# Patient Record
Sex: Male | Born: 1950 | Race: White | Hispanic: No | Marital: Married | Smoking: Current every day smoker
Health system: Southern US, Community
[De-identification: ages and names within clinical notes are randomized; demographics above are authoritative.]

## PROBLEM LIST (undated history)

## (undated) DIAGNOSIS — D72829 Elevated white blood cell count, unspecified: Secondary | ICD-10-CM

## (undated) DIAGNOSIS — I5042 Chronic combined systolic (congestive) and diastolic (congestive) heart failure: Secondary | ICD-10-CM

## (undated) DIAGNOSIS — I4729 Other ventricular tachycardia: Secondary | ICD-10-CM

## (undated) DIAGNOSIS — E785 Hyperlipidemia, unspecified: Secondary | ICD-10-CM

## (undated) DIAGNOSIS — G4733 Obstructive sleep apnea (adult) (pediatric): Secondary | ICD-10-CM

## (undated) DIAGNOSIS — I428 Other cardiomyopathies: Secondary | ICD-10-CM

## (undated) DIAGNOSIS — I472 Ventricular tachycardia: Secondary | ICD-10-CM

## (undated) DIAGNOSIS — E669 Obesity, unspecified: Secondary | ICD-10-CM

## (undated) DIAGNOSIS — I10 Essential (primary) hypertension: Secondary | ICD-10-CM

## (undated) HISTORY — DX: Other ventricular tachycardia: I47.29

## (undated) HISTORY — DX: Elevated white blood cell count, unspecified: D72.829

## (undated) HISTORY — DX: Chronic combined systolic (congestive) and diastolic (congestive) heart failure: I50.42

## (undated) HISTORY — DX: Obstructive sleep apnea (adult) (pediatric): G47.33

## (undated) HISTORY — DX: Obesity, unspecified: E66.9

## (undated) HISTORY — DX: Ventricular tachycardia: I47.2

## (undated) HISTORY — DX: Essential (primary) hypertension: I10

## (undated) HISTORY — DX: Other cardiomyopathies: I42.8

## (undated) HISTORY — DX: Hyperlipidemia, unspecified: E78.5

---

## 2004-02-24 ENCOUNTER — Ambulatory Visit (HOSPITAL_COMMUNITY): Admission: RE | Admit: 2004-02-24 | Discharge: 2004-02-24 | Payer: Self-pay | Admitting: Gastroenterology

## 2004-02-24 ENCOUNTER — Encounter (INDEPENDENT_AMBULATORY_CARE_PROVIDER_SITE_OTHER): Payer: Self-pay | Admitting: Specialist

## 2006-03-12 ENCOUNTER — Emergency Department (HOSPITAL_COMMUNITY): Admission: EM | Admit: 2006-03-12 | Discharge: 2006-03-12 | Payer: Self-pay | Admitting: Emergency Medicine

## 2006-12-05 ENCOUNTER — Encounter: Admission: RE | Admit: 2006-12-05 | Discharge: 2006-12-05 | Payer: Self-pay | Admitting: Cardiology

## 2007-01-25 ENCOUNTER — Encounter: Payer: Self-pay | Admitting: Pulmonary Disease

## 2009-02-14 ENCOUNTER — Ambulatory Visit: Payer: Self-pay | Admitting: Cardiology

## 2009-02-14 ENCOUNTER — Inpatient Hospital Stay (HOSPITAL_COMMUNITY): Admission: EM | Admit: 2009-02-14 | Discharge: 2009-02-16 | Payer: Self-pay | Admitting: Emergency Medicine

## 2009-02-15 ENCOUNTER — Encounter (INDEPENDENT_AMBULATORY_CARE_PROVIDER_SITE_OTHER): Payer: Self-pay | Admitting: Internal Medicine

## 2009-02-20 ENCOUNTER — Telehealth: Payer: Self-pay | Admitting: Cardiology

## 2009-02-23 ENCOUNTER — Telehealth (INDEPENDENT_AMBULATORY_CARE_PROVIDER_SITE_OTHER): Payer: Self-pay | Admitting: *Deleted

## 2009-02-24 DIAGNOSIS — E669 Obesity, unspecified: Secondary | ICD-10-CM | POA: Insufficient documentation

## 2009-02-24 DIAGNOSIS — I10 Essential (primary) hypertension: Secondary | ICD-10-CM

## 2009-02-24 DIAGNOSIS — E785 Hyperlipidemia, unspecified: Secondary | ICD-10-CM

## 2009-02-24 DIAGNOSIS — G4733 Obstructive sleep apnea (adult) (pediatric): Secondary | ICD-10-CM

## 2009-02-24 DIAGNOSIS — I5043 Acute on chronic combined systolic (congestive) and diastolic (congestive) heart failure: Secondary | ICD-10-CM

## 2009-02-24 DIAGNOSIS — I472 Ventricular tachycardia: Secondary | ICD-10-CM

## 2009-02-24 DIAGNOSIS — R0602 Shortness of breath: Secondary | ICD-10-CM | POA: Insufficient documentation

## 2009-02-27 ENCOUNTER — Ambulatory Visit: Payer: Self-pay | Admitting: Pulmonary Disease

## 2009-02-28 ENCOUNTER — Encounter: Payer: Self-pay | Admitting: Pulmonary Disease

## 2009-03-01 ENCOUNTER — Ambulatory Visit: Payer: Self-pay | Admitting: Pulmonary Disease

## 2009-03-02 ENCOUNTER — Ambulatory Visit: Payer: Self-pay | Admitting: Family Medicine

## 2009-03-02 LAB — CONVERTED CEMR LAB
Chloride: 100 meq/L (ref 96–112)
Creatinine, Ser: 1.4 mg/dL (ref 0.4–1.5)
Potassium: 5.2 meq/L — ABNORMAL HIGH (ref 3.5–5.1)
Sodium: 137 meq/L (ref 135–145)

## 2009-03-07 ENCOUNTER — Ambulatory Visit: Payer: Self-pay | Admitting: Cardiology

## 2009-03-21 ENCOUNTER — Telehealth: Payer: Self-pay | Admitting: Nurse Practitioner

## 2009-05-09 ENCOUNTER — Encounter: Payer: Self-pay | Admitting: Family Medicine

## 2009-06-05 ENCOUNTER — Ambulatory Visit: Payer: Self-pay | Admitting: Cardiology

## 2009-06-05 DIAGNOSIS — I5042 Chronic combined systolic (congestive) and diastolic (congestive) heart failure: Secondary | ICD-10-CM | POA: Insufficient documentation

## 2009-09-13 ENCOUNTER — Ambulatory Visit: Payer: Self-pay | Admitting: Cardiology

## 2009-09-13 ENCOUNTER — Ambulatory Visit: Payer: Self-pay

## 2009-09-13 ENCOUNTER — Ambulatory Visit (HOSPITAL_COMMUNITY): Admission: RE | Admit: 2009-09-13 | Discharge: 2009-09-13 | Payer: Self-pay | Admitting: Cardiology

## 2009-10-05 ENCOUNTER — Ambulatory Visit: Payer: Self-pay | Admitting: Pulmonary Disease

## 2010-02-20 NOTE — Progress Notes (Signed)
  Phone Note From Other Clinic   Caller: Nurse Summary of Call: JULIE CALLED THIS AM RE PT'S  B/P 96/54 AND 94/ AFTER STANDING C/O SLIGHT DIZZINESS INSTRUCTED TO HAVE PT KEEP B/P LOG AND IF CONT TO RUN LOW OR HAS WORSENED S/S TO CALL OFF. HAS APPT THIS MONTH WITH DR WALL. VERBALZIED UNDERSTANDING Initial call taken by: Scherrie Bateman, LPN,  February 23, 2009 3:38 PM

## 2010-02-20 NOTE — Assessment & Plan Note (Signed)
Summary: Wesley Castro (TXFR FROM [facility]) // RS   Vital Signs:  Patient profile:   60 year old male Height:      68.75 inches Weight:      245 pounds BMI:     36.58 Temp:     97.9 degrees F oral Pulse rate:   80 / minute Pulse rhythm:   regular Resp:     12 per minute BP sitting:   82 / 60  (left arm) BP standing:   80 / 50 Cuff size:   large  Vitals Entered By: Sid Falcon LPN (March 02, 2009 10:44 AM)  Nutrition Counseling: Patient's BMI is greater than 25 and therefore counseled on weight management options.  Serial Vital Signs/Assessments:  Time      Position  BP       Pulse  Resp  Temp     By           Sitting   82/50                          Evelena Peat MD           Standing  80/50                          Evelena Peat MD  CC: New to establish, Hospital follow-up Is Patient Diabetic? No   History of Present Illness: Hospital followup and patient here to establish care.  Recent admission 25th of January with dyspnea and hypoxic respiratory failure. Initially thought to be COPD exacerbation and patient treated with high-dose steroids and empiric antibiotics. Eventually clear this was heart failure. Patient had echocardiogram which confirmed acute on chronic heart failure with ejection fraction 40-45%. Patient improved with diuresis. No chest pain. BNP 299. CT angiogram of chest revealed no pulmonary emboli. Patient placed on several appropriate heart failure medications and improved dramatically with regard to symptoms. No prior history of known heart failure.  Patient compliant with diet and medications since discharge. Daily weight stable. By his scales 243 pounds. Denies any orthopnea or PND.  no orthostatic symptoms  History obstructive sleep apnea. Recent followup with pulmonologist. Outpatient pulse oximetry. never treated with CPAP.  currently sleeping well.  Other hospital issues included mild steroid-induced hyperglycemia. No prior history of diabetes. Brief  asymptomatic nonsustained ventricular tachycardia. Dyslipidemia with LDL 132 and patient placed on simvastatin. Patient has history of obesity and poor compliance. Smoking until his recent admission but abstinent since then. Recently started Weight Watchers program.  Preventive Screening-Counseling & Management  Alcohol-Tobacco     Smoking Status: quit     Year Started: 1970     Year Quit: 02/14/2009  Allergies (verified): No Known Drug Allergies  Past History:  Past Medical History: Heart arrhythmia Acute on Chronic heart failure, EF 40-45% 1/11 Heart murmur Hyperlipidemia Hypertension  Family History: Family History Lung cancer, maternal grandfather, deceased age 19 Family History Ovarian cancer, mother, deceased age 56  Social History: Occupation:  [employer] Married Alcohol use-yes Smoked all his life, quit Feb 14, 2009 Smoking Status:  quit Occupation:  employed  Review of Systems  The patient denies anorexia, fever, chest pain, syncope, dyspnea on exertion, peripheral edema, prolonged cough, headaches, abdominal pain, and difficulty walking.    Physical Exam  General:  Well-developed,well-nourished,in no acute distress; alert,appropriate and cooperative throughout examination Ears:  External ear exam shows no significant lesions or deformities.  Otoscopic examination reveals  clear canals, tympanic membranes are intact bilaterally without bulging, retraction, inflammation or discharge. Hearing is grossly normal bilaterally. Mouth:  Oral mucosa and oropharynx without lesions or exudates.  Teeth in good repair. Neck:  No deformities, masses, or tenderness noted. Lungs:  Normal respiratory effort, chest expands symmetrically. Lungs are clear to auscultation, no crackles or wheezes. Heart:  normal rate, regular rhythm, no murmur, and no gallop.   Extremities:  no pitting edema noted.   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  Home weights stable. His updated medication list for this problem includes:    Aspirin 81 Mg Tbec (Aspirin) .Marland Kitchen... Take one tablet by mouth daily    Carvedilol 3.125 Mg Tabs (Carvedilol) .Marland Kitchen... 1 tab two times a day    Furosemide 40 Mg Tabs (Furosemide) .Marland Kitchen... 1 tab once daily    Lisinopril 10 Mg Tabs (Lisinopril) .Marland Kitchen... 1 tab once daily    Spironolactone 25 Mg Tabs (Spironolactone) .Marland Kitchen... 1 tab once daily  Orders: TLB-BMP (Basic Metabolic Panel-BMET) (80048-METABOL)  Problem # 2:  HYPERTENSION (ICD-401.9) Low today but not symptomatic.  May have to back off some meds.  Check BMP His updated medication list for this problem includes:    Carvedilol 3.125 Mg Tabs (Carvedilol) .Marland Kitchen... 1 tab two times a day    Furosemide 40 Mg Tabs (Furosemide) .Marland Kitchen... 1 tab once daily    Lisinopril 10 Mg Tabs (Lisinopril) .Marland Kitchen... 1 tab once daily    Spironolactone 25 Mg Tabs (Spironolactone) .Marland Kitchen... 1 tab once daily  Problem # 3:  HYPERLIPIDEMIA (ICD-272.4) Will need lipids in 4-6 weeks. His updated medication list for this problem includes:    Simvastatin 20 Mg Tabs (Simvastatin) .Marland Kitchen... 1 tab once daily  Problem # 4:  OBESITY (ICD-278.00) Working on weight loss.   Problem # 5:  HYPERGLYCEMIA/ STEROID INDUCED (ICD-790.29)  Problem # 6:  SLEEP APNEA, OBSTRUCTIVE (ICD-327.23) F/u with pulmonary.  Complete Medication List: 1)  Aspirin 81 Mg Tbec (Aspirin) .... Take one tablet by mouth daily 2)  Carvedilol 3.125 Mg Tabs (Carvedilol) .Marland Kitchen.. 1 tab two times a day 3)  Furosemide 40 Mg Tabs (Furosemide) .Marland Kitchen.. 1 tab once daily 4)  Lisinopril 10 Mg Tabs (Lisinopril) .Marland Kitchen.. 1 tab once daily 5)  Spironolactone 25 Mg Tabs (Spironolactone) .Marland Kitchen.. 1 tab once daily 6)  Simvastatin 20 Mg Tabs (Simvastatin) .Marland Kitchen.. 1 tab once daily 7)  Ibuprofen 400 Mg Tabs (Ibuprofen) .... As needed  Patient Instructions: 1)  Continue sodium restriction 2)  Continue daily weights 3)  Be in touch immediately  if you have any significant dizziness or lightheadedness 4)  Please schedule a follow-up appointment in 1 month.  5)  BMP prior to visit, ICD-9: 401.9 6)  Hepatic Panel prior to visit ICD-9: 272.4 7)  Lipid panel prior to visit ICD-9 : 272.4  Preventive Care Screening  Colonoscopy:    Date:  01/21/2005    Results:  normal

## 2010-02-20 NOTE — Assessment & Plan Note (Signed)
Summary: eph/jml   Visit Type:  EPH Referring Provider:  Valera Castle      Primary Provider:  Burchett  CC:  pt states he has had a little dizziness since he has come out of the hosp.Marland Kitchenotherwise denies any other complaints today.  History of Present Illness: Mr Chait comes in today for followup post hospitalization.  He is a delightful 60 year old gentleman who presented to [facility] with progressive shortness of breath. He was found to have acute on chronic combined congestive heart failure. He was also hypertensive and had a history of hypertension. 3 echocardiogram showed that he ejection fraction of 45% with global hypokinesia and mild left ventricular dilatation.  He diuresed with normalization of his blood pressures. He was noted to have steroid-induced hyperglycemia. He has some nonsustained ventricular tachycardia which was asymptomatic.  Since discharge, he is taken our heart healthy message to heart. He has lost about a pound a week, has been very careful with his sodium, using no salt, with now low blood pressure. The other day in Dr. Lucie Leather his systolic pressures were in the low 80s. He has been dizzy. Electrolytes showed potassium of 5.2 which is minimally elevated. He is taking spironolactone.   He denies orthopnea, PND or peripheral edema.  He has stopped smoking  Current Medications (verified): 1)  Aspirin 81 Mg Tbec (Aspirin) .... Take One Tablet By Mouth Daily 2)  Carvedilol 3.125 Mg Tabs (Carvedilol) .Marland Kitchen.. 1 Tab Two Times A Day 3)  Furosemide 40 Mg Tabs (Furosemide) .Marland Kitchen.. 1 Tab Once Daily 4)  Lisinopril 10 Mg Tabs (Lisinopril) .Marland Kitchen.. 1 Tab Once Daily 5)  Spironolactone 25 Mg Tabs (Spironolactone) .Marland Kitchen.. 1 Tab Once Daily 6)  Simvastatin 20 Mg Tabs (Simvastatin) .Marland Kitchen.. 1 Tab Once Daily 7)  Ibuprofen 400 Mg Tabs (Ibuprofen) .... As Needed  Allergies (verified): No Known Drug Allergies  Past History:  Past Medical History: Last updated: 03/02/2009 Heart  arrhythmia Acute on Chronic heart failure, EF 40-45% 1/11 Heart murmur Hyperlipidemia Hypertension  Past Surgical History: Last updated: 02/27/2009 none per pt report.   Family History: Last updated: 03/02/2009 Family History Lung cancer, maternal grandfather, deceased age 50 Family History Ovarian cancer, mother, deceased age 56  Social History: Last updated: 03/02/2009 Occupation:  [employer] Married Alcohol use-yes Smoked all his life, quit Feb 14, 2009  Risk Factors: Smoking Status: quit (03/02/2009)  Review of Systems       negative other than history of present illness  Vital Signs:  Patient profile:   60 year old male Height:      68.75 inches Weight:      249 pounds BMI:     37.17 Pulse rate:   65 / minute Pulse rhythm:   irregular BP sitting:   88 / 64  (left arm) BP standing:   78 / 54  (right arm) Cuff size:   large  Vitals Entered By: Danielle Rankin, CMA (March 07, 2009 11:51 AM)  Physical Exam  General:  obese.   Head:  normocephalic and atraumatic Eyes:  PERRLA/EOM intact; conjunctiva and lids normal. Neck:  Neck supple, no JVD. No masses, thyromegaly or abnormal cervical nodes. Chest Espn Zeman:  no deformities or breast masses noted Lungs:  clear to auscultation percussion Heart:  PMI poorly appreciated, normal S1-S2 no gallop, regular rate and rhythm Msk:  Back normal, normal gait. Muscle strength and tone normal. Pulses:  pulses normal in all 4 extremities Extremities:  trace left pedal edema and trace right pedal edema.  Neurologic:  Alert and oriented x 3. Skin:  Intact without lesions or rashes. Psych:  Normal affect.   EKG  Procedure date:  03/07/2009  Findings:      normal sinus rhythm, poor progression inter-precordium, no change  Impression & Recommendations:  Problem # 1:  COMBINED HEART FAILURE, ACUTE ON CHRONIC (ICD-428.43) Assessment Improved The pdated medication list for this problem includes:    Aspirin 81 Mg Tbec  (Aspirin) .Marland Kitchen... Take one tablet by mouth daily    Carvedilol 3.125 Mg Tabs (Carvedilol) .Marland Kitchen... 1 tab two times a day    Furosemide 40 Mg Tabs (Furosemide) .Marland Kitchen... 1 tab once daily    Lisinopril 10 Mg Tabs (Lisinopril) .Marland Kitchen... 1 tab once daily    Spironolactone 25 Mg Tabs (Spironolactone) .Marland Kitchen... 1 tab once daily  Orders: EKG w/ Interpretation (93000)  Problem # 2:  HYPERTENSION (ICD-401.9) Assessment: Improved His blood pressure is now low and he is symptomatic with dizziness. He's been remarkably compliant with his diet decreased sodium. The nose saw his increased his potassium along with the spironolactone.  We will discontinue the spironolactone. He will continue to monitor his salt and will continue use no salt substitute. He will followup with electrolytes with Dr. Caryl Never in a couple weeks. He will check his blood pressure work and send the results to [place]. He will continue to try to lose a pound a week. I am really impressed with his compliance and change in lifestyle. His updated medication list for this problem includes:    Aspirin 81 Mg Tbec (Aspirin) .Marland Kitchen... Take one tablet by mouth daily    Carvedilol 3.125 Mg Tabs (Carvedilol) .Marland Kitchen... 1 tab two times a day    Furosemide 40 Mg Tabs (Furosemide) .Marland Kitchen... 1 tab once daily    Lisinopril 10 Mg Tabs (Lisinopril) .Marland Kitchen... 1 tab once daily    Spironolactone 25 Mg Tabs (Spironolactone) .Marland Kitchen... 1 tab once daily  Problem # 3:  VENTRICULAR TACHYCARDIA (ICD-427.1) Assessment: Unchanged  His updated medication list for this problem includes:    Aspirin 81 Mg Tbec (Aspirin) .Marland Kitchen... Take one tablet by mouth daily    Carvedilol 3.125 Mg Tabs (Carvedilol) .Marland Kitchen... 1 tab two times a day    Lisinopril 10 Mg Tabs (Lisinopril) .Marland Kitchen... 1 tab once daily  Problem # 4:  OBESITY (ICD-278.00) Assessment: Improved  Patient Instructions: 1)  Your physician recommends that you schedule a follow-up appointment in: 3 MONTHS WITH DR Faren Florence   2)  Your physician has recommended you  make the following change in your medication: STOP IBUPROFEN AND SPIRONOLACTONE MAY TAKE TYLENOL

## 2010-02-20 NOTE — Assessment & Plan Note (Signed)
Summary: consult for osa   Copy to:  Valera Castle      Primary Provider/Referring Provider:  Burchett  CC:  Sleep Consult.  History of Present Illness: The pt is a 60y/o male who I have been asked to see for osa.  The pt was diagnosed with severe osa in 2009, with an AHI of 45/hr and desat as low as 77%.  Cpap was recommended for treatment, however the patient felt he could not wear under any circumstances.  Since that time, the pt has lost over 40 pounds, and feels that he is sleeping much better.  He has very little snoring according to his wife, and she has not seen abnormal breathing patterns during sleep.  He goes to bed btw 11-33mn, and arises at 5:30 am to start his day.  He feels that he sleeps well, and feels rested in the mornings upon arising.  The pt notes no sleep pressure during the day with periods of inactivity, and this is in sharp contrast to the past.  He does not doze in evening watching tv, and has no sleepiness with driving.  His epworth score today is 4.  Preventive Screening-Counseling & Management  Alcohol-Tobacco     Smoking Status: quit  Medications Prior to Update: 1)  Aspirin 81 Mg Tbec (Aspirin) .... Take One Tablet By Mouth Daily 2)  Carvedilol 3.125 Mg Tabs (Carvedilol) .Marland Kitchen.. 1 Tab Two Times A Day 3)  Furosemide 40 Mg Tabs (Furosemide) .Marland Kitchen.. 1 Tab Once Daily 4)  Lisinopril 10 Mg Tabs (Lisinopril) .Marland Kitchen.. 1 Tab Once Daily 5)  Spironolactone 25 Mg Tabs (Spironolactone) .Marland Kitchen.. 1 Tab Once Daily 6)  Simvastatin 20 Mg Tabs (Simvastatin) .Marland Kitchen.. 1 Tab Once Daily 7)  Ibuprofen 400 Mg Tabs (Ibuprofen) .... As Needed  Allergies (verified): No Known Drug Allergies  Past History:  Past Medical History:  HYPERTENSION (ICD-401.9) COMBINED HEART FAILURE, ACUTE ON CHRONIC (ICD-428.43) VENTRICULAR TACHYCARDIA (ICD-427.1) DYSLIPIDEMIA (ICD-272.4) OBESITY (ICD-278.00) LEUKOCYTOSIS/ STEROID INDUCED (ICD-288.60) HYPERGLYCEMIA/ STEROID INDUCED (ICD-790.29) SLEEP APNEA,  OBSTRUCTIVE (ICD-327.23)    Past Surgical History: none per pt report.   Family History: Reviewed history and no changes required. cancer: mother (male organs) maternal grandmother ( skin), maternal grandfather (lung)   Social History: Reviewed history and no changes required. Patient states former smoker.  started at [facility] 14.  1 1/2 ppd.  quit Jan 2011. pt is married. pt has children. pt works at [employer].  Smoking Status:  quit  Review of Systems       The patient complains of shortness of breath with activity, productive cough, non-productive cough, irregular heartbeats, and weight change.  The patient denies shortness of breath at rest, coughing up blood, chest pain, acid heartburn, indigestion, loss of appetite, abdominal pain, difficulty swallowing, sore throat, tooth/dental problems, headaches, nasal congestion/difficulty breathing through nose, sneezing, itching, ear ache, anxiety, depression, hand/feet swelling, joint stiffness or pain, rash, change in color of mucus, and fever.    Vital Signs:  Patient profile:   61 year old male Height:      70 inches Weight:      250.50 pounds BMI:     36.07 O2 Sat:      94 % on Room air Temp:     97.9 degrees F oral Pulse rate:   69 / minute BP sitting:   108 / 68  (left arm) Cuff size:   large  Vitals Entered By: Arman Filter LPN (February 27, 2009 11:16 AM)  O2 Flow:  Room  air  Physical Exam  General:  ow male in nad  Eyes:  PERRLA and EOMI.   Nose:  deviated septum to right with crusting bilat Mouth:  moderate elongation of soft palate and uvula Neck:  no jvd, tmg, LN Lungs:  clear to auscultation Heart:  rrr, with occas ectopic no mrg Abdomen:  soft and nontender, bs+ Extremities:  no signficant edema, pulses intact distally no cyanosis Neurologic:  alert and oriented, moves all 4.   Impression & Recommendations:  Problem # 1:  SLEEP APNEA, OBSTRUCTIVE (ICD-327.23) the pt has a h/o severe osa, but has lost  a significant amount of weight and feels that he is no longer symptomatic.  He sleeps well, awakens refreshed, denies daytime sleepiness, and his wife has noted very little snoring.  Given the severity of his past osa, and his underlying comorbid cardiac disease, I think he needs to be re-studied.  We should be able to establish his level of sleep disorded breathing with a screening study at home given the circumstances.  The pt is agreeable to this approach.  Other Orders: Consultation Level IV (16109) Misc. Referral (Misc. Ref)  Patient Instructions: 1)  will check screening study at home to see if you still have signficant sleep apnea.  I will call with results. 2)  continue to work on weight loss with goal of 200 pounds for now.

## 2010-02-20 NOTE — Assessment & Plan Note (Signed)
Summary: rov for osa   Copy to:  Valera Castle      Primary Kyrie Bun/Referring Tomothy Eddins:  Burchett  CC:  follow up on sleep pt not on cpap.  Pt states cardiology told him to make this apt to f/u on sleep. pt states he has no complaints or concerns today. Per apnea link results in feb  pt was to work on weight loss and f/u in 6 month..  History of Present Illness: the pt comes in today for f/u of his known mild osa.  He has a h/o severe osa, but lost at least 50 pounds, and his f/u study showed mild disease with AHI 9/hr.  We agreed that he would stop cpap, continue to work on weight loss, and he would f/u with me in 6mos to check on progress.  He still does not snore, feels he sleeps well, and denies daytime sleepiness with inactivity.  However, he has gained 17 pounds back since his last visit with me.  Current Medications (verified): 1)  Aspirin 81 Mg Tbec (Aspirin) .... Take One Tablet By Mouth Daily 2)  Carvedilol 3.125 Mg Tabs (Carvedilol) .Marland Kitchen.. 1 Tab Two Times A Day 3)  Furosemide 40 Mg Tabs (Furosemide) .Marland Kitchen.. 1 Tab Once Daily 4)  Lisinopril 10 Mg Tabs (Lisinopril) .Marland Kitchen.. 1 Tab Once Daily 5)  Simvastatin 20 Mg Tabs (Simvastatin) .Marland Kitchen.. 1 Tab At Bedtime  Allergies (verified): No Known Drug Allergies  Review of Systems  The patient denies shortness of breath at rest, productive cough, non-productive cough, coughing up blood, chest pain, irregular heartbeats, acid heartburn, indigestion, loss of appetite, weight change, abdominal pain, difficulty swallowing, sore throat, tooth/dental problems, headaches, nasal congestion/difficulty breathing through nose, sneezing, itching, ear ache, anxiety, depression, hand/feet swelling, joint stiffness or pain, rash, change in color of mucus, and fever.    Vital Signs:  Patient profile:   60 year old male Height:      68.75 inches Weight:      267 pounds BMI:     39.86 O2 Sat:      94 % on Room air Temp:     99.0 degrees F oral Pulse rate:   71 /  minute BP sitting:   114 / 70  (right arm) Cuff size:   large  Vitals Entered By: Carver Fila (October 05, 2009 3:36 PM)  O2 Flow:  Room air CC: follow up on sleep pt not on cpap.  Pt states cardiology told him to make this apt to f/u on sleep. pt states he has no complaints or concerns today. Per apnea link results in feb  pt was to work on weight loss and f/u in 6 month. Comments meds and allergies updated Phone number updated Carver Fila  October 05, 2009 3:34 PM    Physical Exam  General:  ow male in nad Nose:  no purulence or drainage. Extremities:  mild edema, no cyanosis  Neurologic:  alert, does not appear sleepy, moves all 4.   Impression & Recommendations:  Problem # 1:  SLEEP APNEA, OBSTRUCTIVE (ICD-327.23) the pt has mild osa documented by his last npsg, but has gained 17 pounds since the last visit.  He remains asymptomatic, but I am more concerned about his cardiac risks.  He and I have had a long conversation about this, and he is getting back on his weight watchers program.  I have asked him to consider getting back on cpap while working on weight loss, but he is not overly happy about  this.  We have agreed that he will get back on his weight loss program, but if he is not losing weight, he is to call me.  We will see where he is at next followup with Dr. Daleen Squibb.  Other Orders: Est. Patient Level III (16109)  Patient Instructions: 1)  work hard on losing weight with weight watchers. 2)  you have a followup with Dr.Wall, and we will see how your weight is doing.  If you are making progress, I would continue working on weight loss.  If you are not progressing, I would recommend restarting cpap in order to reduce your cardiac risks.

## 2010-02-20 NOTE — Assessment & Plan Note (Signed)
Summary: 3 MI=ONTH/D.MILLER   Visit Type:  3 mo f/u Referring Provider:  Valera Castle      Primary Provider:  Burchett  CC:  no cardiac complaints today..pt weight is up 11 lb since 2/11.  History of Present Illness: Wesley Castro returns today for evaluation management of his chronic combined congestive heart failure.  He has been extremely tight on his sodium use. His blood pressures at all time low with minimal meds. He is walking on a daily basis. His stamina and his shortness of breath improved. He is having no ischemia or palpitations.  Unfortunately stone backup about 12 pounds. After talking to him, is clearly related to carbohydrates.  Unfortunately, he started smoking about a half a pack of cigarettes a day again. I have once again counseled him to quit.  Current Medications (verified): 1)  Aspirin 81 Mg Tbec (Aspirin) .... Take One Tablet By Mouth Daily 2)  Carvedilol 3.125 Mg Tabs (Carvedilol) .Marland Kitchen.. 1 Tab Two Times A Day 3)  Furosemide 40 Mg Tabs (Furosemide) .Marland Kitchen.. 1 Tab Once Daily 4)  Lisinopril 10 Mg Tabs (Lisinopril) .Marland Kitchen.. 1 Tab Once Daily 5)  Simvastatin 20 Mg Tabs (Simvastatin) .Marland Kitchen.. 1 Tab At Bedtime  Allergies (verified): No Known Drug Allergies  Past History:  Past Medical History: Last updated: 03/02/2009 Heart arrhythmia Acute on Chronic heart failure, EF 40-45% 1/11 Heart murmur Hyperlipidemia Hypertension  Past Surgical History: Last updated: 02/27/2009 none per pt report.   Family History: Last updated: 03/02/2009 Family History Lung cancer, maternal grandfather, deceased age 14 Family History Ovarian cancer, mother, deceased age 71  Social History: Last updated: 03/02/2009 Occupation:  [employer] Married Alcohol use-yes Smoked all his life, quit Feb 14, 2009  Risk Factors: Smoking Status: quit (03/02/2009)  Review of Systems       date of history of present illness  Vital Signs:  Patient profile:   60 year old male Height:      68.75  inches Weight:      260 pounds BMI:     38.81 Pulse rate:   68 / minute Pulse rhythm:   regular BP sitting:   100 / 70  (left arm) Cuff size:   large  Vitals Entered By: Danielle Rankin, CMA (Jun 05, 2009 9:06 AM)  Physical Exam  General:  obese.  obese.   Head:  normocephalic and atraumatic Eyes:  PERRLA/EOM intact; conjunctiva and lids normal. Neck:  Neck supple, no JVD. No masses, thyromegaly or abnormal cervical nodes. Chest Manreet Kiernan:  no deformities or breast masses noted Lungs:  Clear bilaterally to auscultation and percussion. Heart:  Non-displaced PMI, chest non-tender; regular rate and rhythm, S1, S2 without murmurs, rubs or gallops. Carotid upstroke normal, no bruit. Normal abdominal aortic size, no bruits. Femorals normal pulses, no bruits. Pedals normal pulses. No edema, no varicosities. Abdomen:  Bowel sounds positive; abdomen soft and non-tender without masses, organomegaly, or hernias noted. No hepatosplenomegaly. Msk:  Back normal, normal gait. Muscle strength and tone normal. Pulses:  pulses normal in all 4 extremities Extremities:  No clubbing or cyanosis. Neurologic:  Alert and oriented x 3. Skin:  Intact without lesions or rashes. Psych:  Normal affect.   Problems:  Medical Problems Added: 1)  Dx of Combined Heart Failure, Chronic  (ICD-428.42)  Impression & Recommendations:  Problem # 1:  COMBINED HEART FAILURE, CHRONIC (ICD-428.42) Assessment Improved I think he is euvolemic but just dangling body mass and dietary discretion with carbohydrates. We have emphasized a low-carb diet and we'll  liberalize his sodium a little bit. His stamina has improved and his ability to walk is unhindered now. He says he feels better. I will see him back in 3 months at which time we will repeat his echocardiogram. Hopefully we'll see some increase in LV systolic function. The following medications were removed from the medication list:    Spironolactone 25 Mg Tabs (Spironolactone)  .Marland Kitchen... 1 tab once daily His updated medication list for this problem includes:    Aspirin 81 Mg Tbec (Aspirin) .Marland Kitchen... Take one tablet by mouth daily    Carvedilol 3.125 Mg Tabs (Carvedilol) .Marland Kitchen... 1 tab two times a day    Furosemide 40 Mg Tabs (Furosemide) .Marland Kitchen... 1 tab once daily    Lisinopril 10 Mg Tabs (Lisinopril) .Marland Kitchen... 1 tab once daily  Problem # 2:  HYPERLIPIDEMIA (ICD-272.4)  The following medications were removed from the medication list:    Simvastatin 20 Mg Tabs (Simvastatin) .Marland Kitchen... 1 tab once daily His updated medication list for this problem includes:    Simvastatin 20 Mg Tabs (Simvastatin) .Marland Kitchen... 1 tab at bedtime  Problem # 3:  HYPERTENSION (ICD-401.9) Assessment: Improved  This is remarkably better. At this point we'll make no further changes in his carvedilol and lisinopril. He could not tolerate spironolactone because of hyperkalemia. He has been extremely cognizant of his sodium intake. Unfortunately led to increased carbohydrates and fruit and his weight is back up. We have given him a low-carb diet and ask him to liberalize his sodium a little bit to get him some variation. The following medications were removed from the medication list:    Spironolactone 25 Mg Tabs (Spironolactone) .Marland Kitchen... 1 tab once daily His updated medication list for this problem includes:    Aspirin 81 Mg Tbec (Aspirin) .Marland Kitchen... Take one tablet by mouth daily    Carvedilol 3.125 Mg Tabs (Carvedilol) .Marland Kitchen... 1 tab two times a day    Furosemide 40 Mg Tabs (Furosemide) .Marland Kitchen... 1 tab once daily    Lisinopril 10 Mg Tabs (Lisinopril) .Marland Kitchen... 1 tab once daily  Orders: Echocardiogram (Echo)  Problem # 4:  VENTRICULAR TACHYCARDIA (ICD-427.1) Assessment: Unchanged  His updated medication list for this problem includes:    Aspirin 81 Mg Tbec (Aspirin) .Marland Kitchen... Take one tablet by mouth daily    Carvedilol 3.125 Mg Tabs (Carvedilol) .Marland Kitchen... 1 tab two times a day    Lisinopril 10 Mg Tabs (Lisinopril) .Marland Kitchen... 1 tab once  daily  Problem # 5:  OBESITY (ICD-278.00) Assessment: Deteriorated  Problem # 6:  SLEEP APNEA, OBSTRUCTIVE (ICD-327.23) Assessment: Unchanged  Patient Instructions: 1)  Your physician recommends that you schedule a follow-up appointment in: 3 MONTHS WITH DR Jhayla Podgorski AND ECHO SAME DAY 2)  Your physician recommends that you continue on your current medications as directed. Please refer to the Current Medication list given to you today. 3)  Your physician has requested that you have an echocardiogram.  Echocardiography is a painless test that uses sound waves to create images of your heart. It provides your doctor with information about the size and shape of your heart and how well your heart's chambers and valves are working.  This procedure takes approximately one hour. There are no restrictions for this procedure. 4)  Your physician has requested that you limit the intake of sodium (salt) in your diet to two grams daily. Please see [facility] handout. 5)  Your physician encouraged you to lose weight for better health. 6)  LOW CARB DIET

## 2010-02-20 NOTE — Assessment & Plan Note (Signed)
Summary: apnea link shows AHI 9/hr.   History of Present Illness: the pt has returned the apnea link device for interpretation.  He was found to have very mild osa with AHI of 9/hr, and desat to 77% transiently.  Given the pt's minimal symptoms, and his motivation to lose weight, would give him time to continue working on weight loss.  This degree of sleep apnea represents very little increased CV risk to the patient.  He feels very strongly that he cannot tolerate cpap, but could consider UA surgery of dental appliance if he was unable to lose weight.     Other Orders: Pulse Oximetry, Overnight (54098)  Patient Instructions: 1)  work on weight loss 2)  folllow up with me in 6mos.  Appended Document: apnea link shows AHI 9/hr. Aundra Millet, please let pt know that his apnea link shows mild sleep apnea, with 9 times per hour cutting off breathing during sleep.  He needs to continue working on weight loss very aggressively as we discussed, and I would like to see him back in 6mos.  Appended Document: apnea link shows AHI 9/hr. LMOMTCB  Appended Document: apnea link shows AHI 9/hr. called, spoke with pt.  Pt informed of above statement per KC.  He verbalized understanding and had no questions.  Advised pt reminder will be put in system for 6 mo.  Reminder placed in IDX.

## 2010-02-20 NOTE — Progress Notes (Signed)
Summary: Cardiology Phone Note - Med Refill Request  Phone Note Call from Patient   Caller: Patient Summary of Call: pt reports that he ran out of simva 20 about 3 days ago and that pharmacy has been trying to contact office but no refill has been called in.  I called in a Rx for simvastatin 20mg  by mouth at bedtime, #30, 6 refills to his [facility] @ [phone]. Initial call taken by: Creig Hines, ANP-BC,  March 21, 2009 5:52 PM

## 2010-02-20 NOTE — Assessment & Plan Note (Signed)
Summary: 3 months rov/ echo @ 3pm/sl   Visit Type:  3 mo f/u Referring Braelin Costlow:  Valera Castle      Primary Geordie Nooney:  Burchett  CC:  no cardiac complaints today.  History of Present Illness: Wesley Castro comes in today for followup.  Unfortunately, he remains on the same diet he's been on but his weight is up an additional 5 pounds since May. His weight is up about 15 pounds since we saw him in February.  He smoking again. He is not walking but 30 minutes a couple times a week.  He denies any angina or chest discomfort.  He's had no PND, orthopnea or increased edema.  His echocardiogram today shows no change in LV systolic function. His ejection is 45% with global hypokinesia. He is grade 1 diastolic dysfunction. His left H. was mildly enlarged there is no significant valvular disease. Right-sided function and size are normal. The right atrium is mildly dilated. There is no significant evidence of pulmonary hypertension.    Current Medications (verified): 1)  Aspirin 81 Mg Tbec (Aspirin) .... Take One Tablet By Mouth Daily 2)  Carvedilol 3.125 Mg Tabs (Carvedilol) .Marland Kitchen.. 1 Tab Two Times A Day 3)  Furosemide 40 Mg Tabs (Furosemide) .Marland Kitchen.. 1 Tab Once Daily 4)  Lisinopril 10 Mg Tabs (Lisinopril) .Marland Kitchen.. 1 Tab Once Daily 5)  Simvastatin 20 Mg Tabs (Simvastatin) .Marland Kitchen.. 1 Tab At Bedtime  Allergies (verified): No Known Drug Allergies  Past History:  Past Medical History: Last updated: 03/02/2009 Heart arrhythmia Acute on Chronic heart failure, EF 40-45% 1/11 Heart murmur Hyperlipidemia Hypertension  Past Surgical History: Last updated: 02/27/2009 none per pt report.   Family History: Last updated: 03/02/2009 Family History Lung cancer, maternal grandfather, deceased age 45 Family History Ovarian cancer, mother, deceased age 33  Social History: Last updated: 03/02/2009 Occupation:  [employer] Married Alcohol use-yes Smoked all his life, quit Feb 14, 2009  Risk Factors: Smoking  Status: quit (03/02/2009)  Review of Systems       negative other than history of present illness  Vital Signs:  Patient profile:   60 year old male Height:      68.75 inches Weight:      263.8 pounds BMI:     39.38 Pulse rate:   62 / minute Pulse rhythm:   regular BP sitting:   108 / 70  (left arm) Cuff size:   large  Vitals Entered By: Danielle Rankin, CMA (September 13, 2009 4:26 PM)  Physical Exam  General:  obese.  muscular. No acute distress Head:  normocephalic and atraumatic Eyes:  PERRLA/EOM intact; conjunctiva and lids normal. Neck:  Neck supple, no JVD. No masses, thyromegaly or abnormal cervical nodes. Chest Wall:  no deformities or breast masses noted Lungs:  Clear bilaterally to auscultation and percussion. Heart:  PMI poorly appreciated, normal S1-S2, no gallop, regular rate and rhythm negative carotid bruit Msk:  Back normal, normal gait. Muscle strength and tone normal. Pulses:  pulses normal in all 4 extremities Extremities:  No clubbing or cyanosis. Neurologic:  Alert and oriented x 3. Skin:  Intact without lesions or rashes. Psych:  Normal affect.   Impression & Recommendations:  Problem # 1:  COMBINED HEART FAILURE, CHRONIC (ICD-428.42) Assessment Unchanged He is having no signs of decompensated heart failure. His left ventricular function is not improved which is not surprising considering the fact that the only variable we've controlled is his blood pressure.I have encouraged him to try to lose weight, increase his walking  to 3 hours a week. He is on good medications and will not chase him. His blood pressure is under excellent control. His updated medication list for this problem includes:    Aspirin 81 Mg Tbec (Aspirin) .Marland Kitchen... Take one tablet by mouth daily    Carvedilol 3.125 Mg Tabs (Carvedilol) .Marland Kitchen... 1 tab two times a day    Furosemide 40 Mg Tabs (Furosemide) .Marland Kitchen... 1 tab once daily    Lisinopril 10 Mg Tabs (Lisinopril) .Marland Kitchen... 1 tab once daily  Problem  # 2:  HYPERLIPIDEMIA (ICD-272.4)  His updated medication list for this problem includes:    Simvastatin 20 Mg Tabs (Simvastatin) .Marland Kitchen... 1 tab at bedtime  Problem # 3:  HYPERTENSION (ICD-401.9) Assessment: Improved  His updated medication list for this problem includes:    Aspirin 81 Mg Tbec (Aspirin) .Marland Kitchen... Take one tablet by mouth daily    Carvedilol 3.125 Mg Tabs (Carvedilol) .Marland Kitchen... 1 tab two times a day    Furosemide 40 Mg Tabs (Furosemide) .Marland Kitchen... 1 tab once daily    Lisinopril 10 Mg Tabs (Lisinopril) .Marland Kitchen... 1 tab once daily  Problem # 4:  VENTRICULAR TACHYCARDIA (ICD-427.1)  His updated medication list for this problem includes:    Aspirin 81 Mg Tbec (Aspirin) .Marland Kitchen... Take one tablet by mouth daily    Carvedilol 3.125 Mg Tabs (Carvedilol) .Marland Kitchen... 1 tab two times a day    Lisinopril 10 Mg Tabs (Lisinopril) .Marland Kitchen... 1 tab once daily  Problem # 5:  OBESITY (ICD-278.00) Assessment: Deteriorated  Problem # 6:  DYSLIPIDEMIA (ICD-272.4)  His updated medication list for this problem includes:    Simvastatin 20 Mg Tabs (Simvastatin) .Marland Kitchen... 1 tab at bedtime  Problem # 7:  SLEEP APNEA, OBSTRUCTIVE (ICD-327.23)  Patient Instructions: 1)  Your physician recommends that you schedule a follow-up appointment in: 6 months with Dr. Daleen Squibb 2)  Your physician recommends that you continue on your current medications as directed. Please refer to the Current Medication list given to you today. 3)  You have been referred to pulmonary for your sleep apnea 4)  Your physician encouraged you to lose weight for better health.

## 2010-02-20 NOTE — Miscellaneous (Signed)
Summary: Certification and Plan of Care/[facility]  Certification and Plan of Care/[facility]   Imported By: Maryln Gottron 05/10/2009 15:25:17  _____________________________________________________________________  External Attachment:    Type:   Image     Comment:   External Document

## 2010-02-20 NOTE — Progress Notes (Signed)
Summary: add medication  Phone Note From Other Clinic   Caller: nurse stephanie Summary of Call: Per [facility] would like to know if you would add potasium to pt meds. [phone] Initial call taken by: Edman Circle,  February 20, 2009 9:35 AM  Follow-up for Phone Call        Has his K been checked? He is on Spironolactone and Lisinopril and may not need any K. Follow-up by: Gaylord Shih, MD, [facility],  February 20, 2009 1:39 PM     Appended Document: add medication WAS JUST DISCHARGED FROM HOSP LAST K WAS 4.4 ON 02/16/09 HAS APPT WITH YOU ON 03/07/09 AT 1:45

## 2010-04-09 LAB — CBC
Hemoglobin: 14 g/dL (ref 13.0–17.0)
Hemoglobin: 14.9 g/dL (ref 13.0–17.0)
MCHC: 33.3 g/dL (ref 30.0–36.0)
MCV: 87.9 fL (ref 78.0–100.0)
Platelets: 135 10*3/uL — ABNORMAL LOW (ref 150–400)
Platelets: 142 10*3/uL — ABNORMAL LOW (ref 150–400)
Platelets: 149 10*3/uL — ABNORMAL LOW (ref 150–400)
RBC: 5.02 MIL/uL (ref 4.22–5.81)
RDW: 14.4 % (ref 11.5–15.5)
RDW: 14.5 % (ref 11.5–15.5)
WBC: 16 10*3/uL — ABNORMAL HIGH (ref 4.0–10.5)
WBC: 19.6 10*3/uL — ABNORMAL HIGH (ref 4.0–10.5)
WBC: 8.3 10*3/uL (ref 4.0–10.5)

## 2010-04-09 LAB — TROPONIN I
Troponin I: 0.05 ng/mL (ref 0.00–0.06)
Troponin I: 0.05 ng/mL (ref 0.00–0.06)

## 2010-04-09 LAB — BASIC METABOLIC PANEL
BUN: 10 mg/dL (ref 6–23)
BUN: 14 mg/dL (ref 6–23)
BUN: 24 mg/dL — ABNORMAL HIGH (ref 6–23)
CO2: 27 mEq/L (ref 19–32)
CO2: 31 mEq/L (ref 19–32)
Calcium: 9.2 mg/dL (ref 8.4–10.5)
Chloride: 101 mEq/L (ref 96–112)
Chloride: 102 mEq/L (ref 96–112)
Creatinine, Ser: 0.79 mg/dL (ref 0.4–1.5)
GFR calc Af Amer: >60 mL/min (ref >60)
GFR calc Af Amer: >60 mL/min (ref >60)
GFR calc non Af Amer: >60 mL/min (ref >60)
Glucose, Bld: 109 mg/dL — ABNORMAL HIGH (ref 70–99)
Potassium: 4.2 mEq/L (ref 3.5–5.1)
Potassium: 4.4 mEq/L (ref 3.5–5.1)
Sodium: 137 mEq/L (ref 135–145)

## 2010-04-09 LAB — DIFFERENTIAL
Basophils Absolute: 0.1 10*3/uL (ref 0.0–0.1)
Basophils Relative: 1 % (ref 0–1)
Eosinophils Relative: 0 % (ref 0–5)
Eosinophils Relative: 1 % (ref 0–5)
Lymphocytes Relative: 17 % (ref 12–46)
Lymphocytes Relative: 4 % — ABNORMAL LOW (ref 12–46)
Lymphs Abs: 0.6 10*3/uL — ABNORMAL LOW (ref 0.7–4.0)
Monocytes Absolute: 0.1 10*3/uL (ref 0.1–1.0)
Monocytes Absolute: 0.4 10*3/uL (ref 0.1–1.0)
Neutro Abs: 15.2 10*3/uL — ABNORMAL HIGH (ref 1.7–7.7)
Neutro Abs: 6.3 10*3/uL (ref 1.7–7.7)

## 2010-04-09 LAB — LIPID PANEL
LDL Cholesterol: 132 mg/dL — ABNORMAL HIGH (ref 0–99)
Total CHOL/HDL Ratio: 5 RATIO
Triglycerides: 93 mg/dL (ref <150)
VLDL: 19 mg/dL (ref 0–40)

## 2010-04-09 LAB — CK TOTAL AND CKMB (NOT AT ARMC)
Total CK: 116 U/L (ref 7–232)
Total CK: 82 U/L (ref 7–232)

## 2010-04-09 LAB — BRAIN NATRIURETIC PEPTIDE
Pro B Natriuretic peptide (BNP): 280 pg/mL — ABNORMAL HIGH (ref 0.0–100.0)
Pro B Natriuretic peptide (BNP): 299 pg/mL — ABNORMAL HIGH (ref 0.0–100.0)
Pro B Natriuretic peptide (BNP): 444 pg/mL — ABNORMAL HIGH (ref 0.0–100.0)

## 2010-04-09 LAB — HEMOGLOBIN A1C: Mean Plasma Glucose: 126 mg/dL

## 2010-04-09 LAB — D-DIMER, QUANTITATIVE: D-Dimer, Quant: 3.67 ug/mL-FEU — ABNORMAL HIGH (ref 0.00–0.48)

## 2010-06-06 ENCOUNTER — Other Ambulatory Visit: Payer: Self-pay | Admitting: Cardiology

## 2010-06-08 NOTE — Op Note (Signed)
NAMELUISMARIO, COSTON NO.:  [id number]   MEDICAL RECORD NO.:  [id number]          PATIENT TYPE:  AMB   LOCATION:  ENDO                         FACILITY:  [facility]   PHYSICIAN:  Graylin Shiver, M.D.   DATE OF BIRTH:  [birth year]   DATE OF PROCEDURE:  02/24/2004  DATE OF DISCHARGE:                                 OPERATIVE REPORT   PROCEDURE:  Colonoscopy with biopsy.   INDICATIONS FOR PROCEDURE:  Screening.   CONSENT:  Informed consent was obtained after explanation of the risks of  bleeding, infection, and perforation.   PREMEDICATION:  Fentanyl 75 mcg IV, Versed 7 mg IV.   DESCRIPTION OF PROCEDURE:  With the patient in the left lateral decubitus  position, a rectal exam was performed.  No masses were felt.  The Olympus  colonoscope was inserted into the rectum and advanced around the colon to  the cecum.  Cecal landmarks were identified.  The cecum looked normal.  In  the proximal ascending colon, there was a 3-mm sessile polyp biopsied off  with cold forceps.  The transverse colon looked normal.  The descending  colon, sigmoid, and rectum looked normal.  He tolerated the procedure well  without complications.   IMPRESSION:  Colon polyp.  Diagnosis code 211.3.   PLAN:  The pathology will be checked.      SFG/MEDQ  D:  02/24/2004  T:  02/24/2004  Job:  [id number]   cc:   Teena Irani. Arlyce Dice, M.D.  [id number]  [place]  [place] [address]  Fax: [phone]

## 2010-10-02 IMAGING — CT CT ANGIO CHEST
1 of 2 series · 19 of 32 positions shown · IV contrast (APPLIED)
Comparison: None.

CLINICAL DATA: Chest pain and shortness of breath.  Elevated D-
dimer.  Clinical suspicion for pulmonary embolism.

CT ANGIOGRAPHY CHEST WITH CONTRAST
TECHNIQUE: Multidetector CT imaging of the chest was performed
using the standard protocol during bolus administration of
intravenous contrast.  Multiplanar CT image reconstructions
including MIPs were obtained to evaluate the vascular anatomy.
Contrast:  100 ml Amnipaque-9AA

[Series 5: pe thins @ 1mm · axial · 0.70mm/px · z∈[+1567,+1860]mm · 19 of 321 slices shown]
[im 14/321  lung]
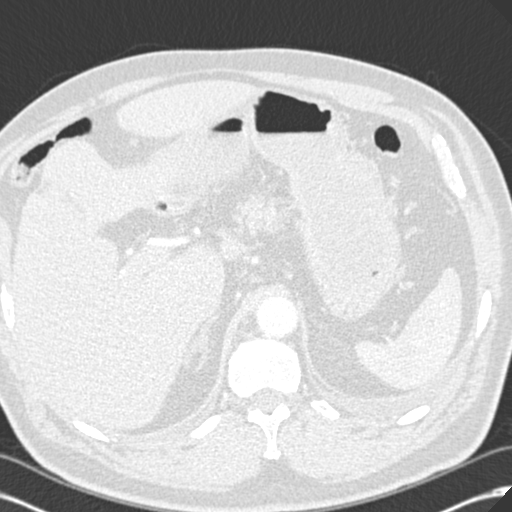
[im 28/321  soft-tissue]
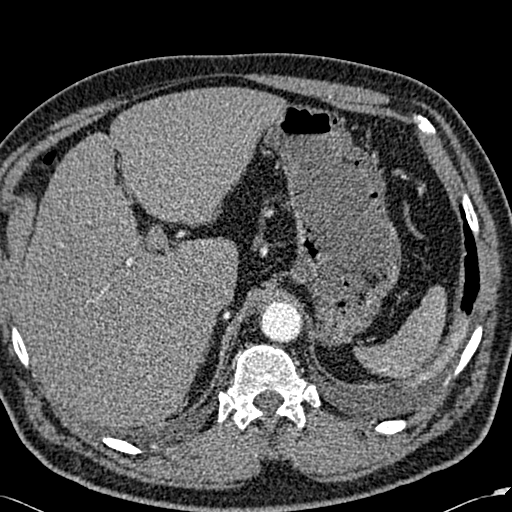
[im 42/321  lung]
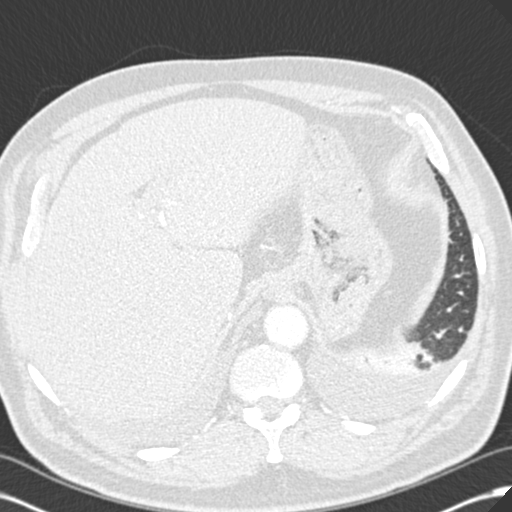
[im 70/321  soft-tissue]
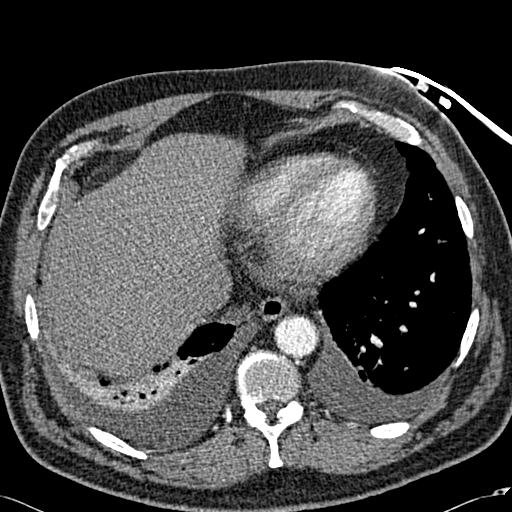
[im 84/321  lung]
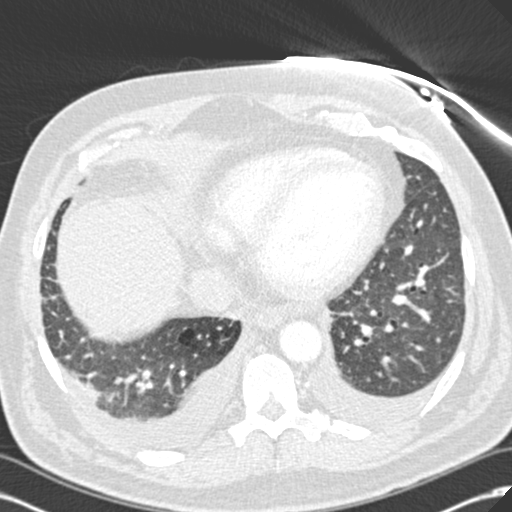
[im 98/321  soft-tissue]
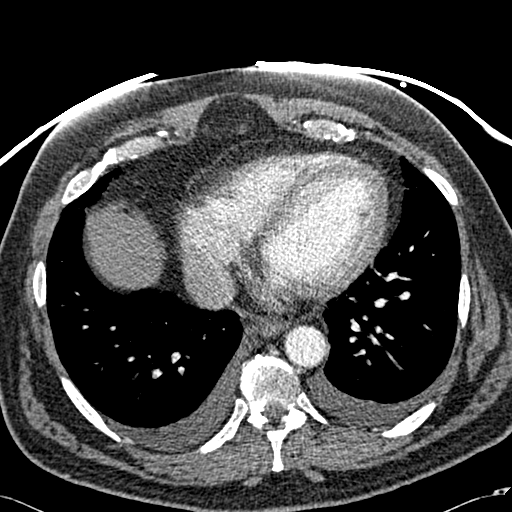
[im 112/321  lung]
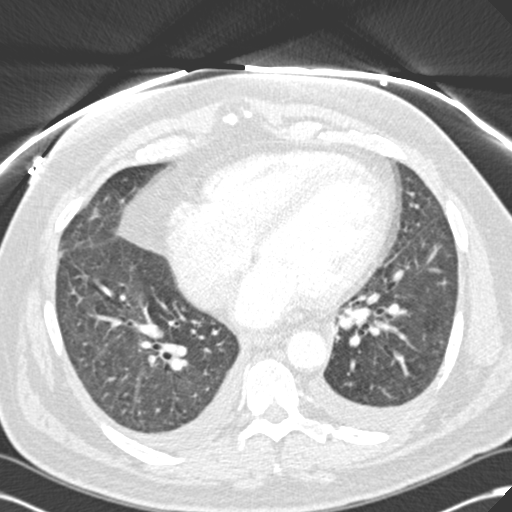
[im 126/321  soft-tissue]
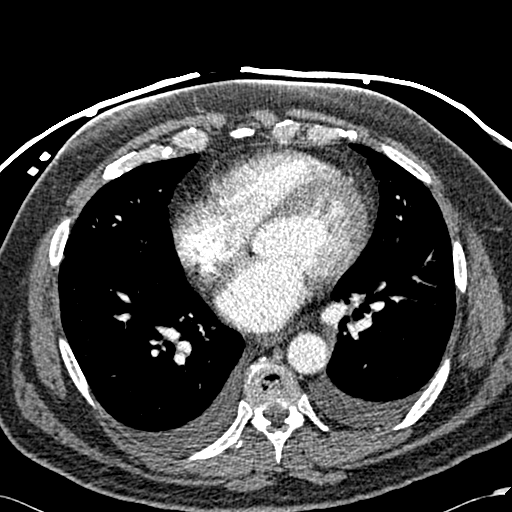
[im 140/321  lung]
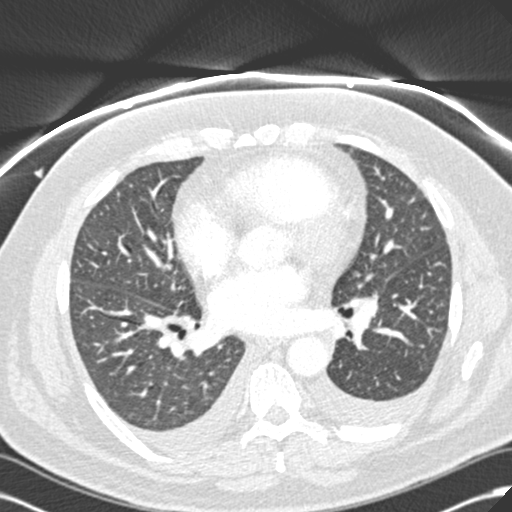
[im 167/321  soft-tissue]
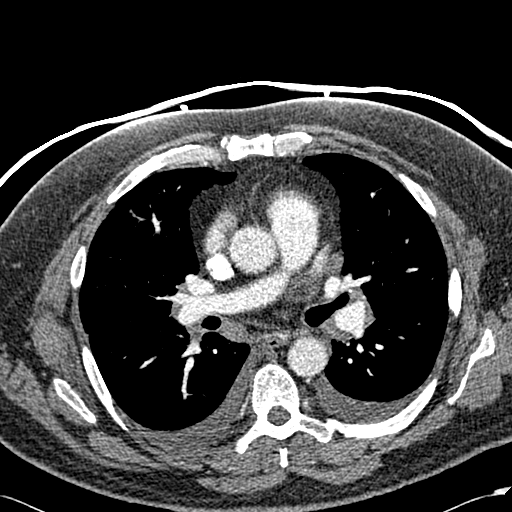
[im 181/321  lung]
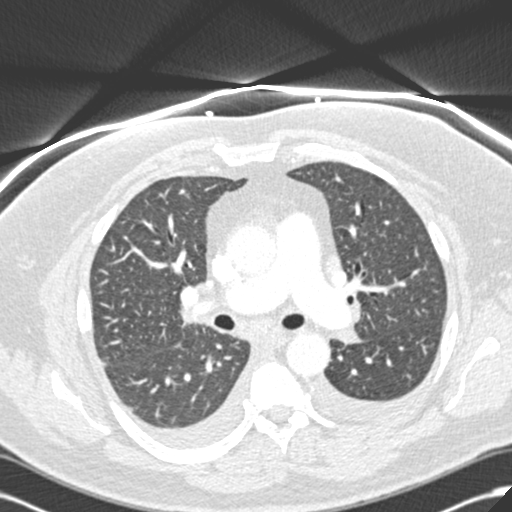
[im 195/321  soft-tissue]
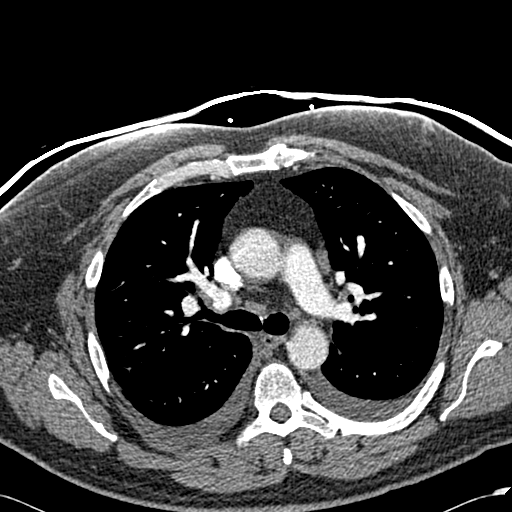
[im 209/321  lung]
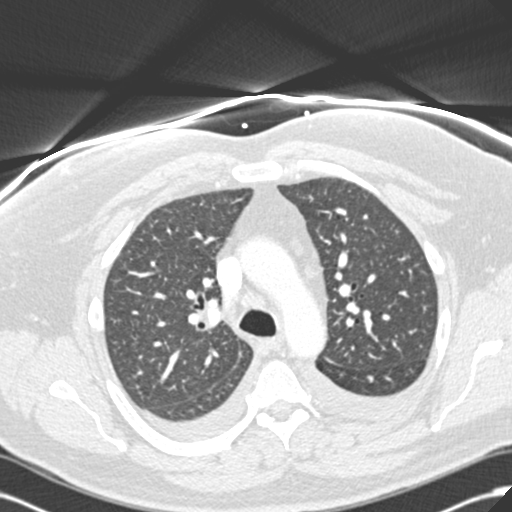
[im 223/321  soft-tissue]
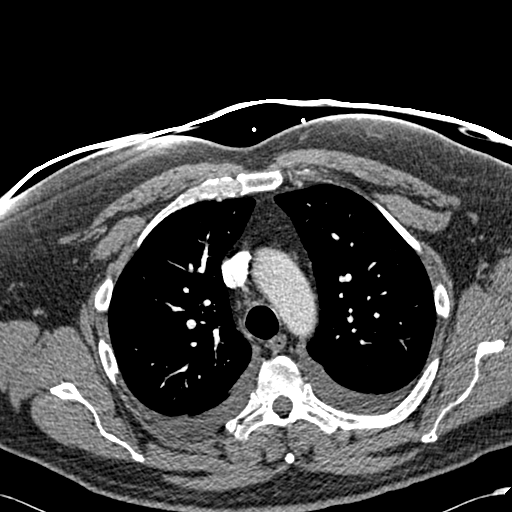
[im 237/321  lung]
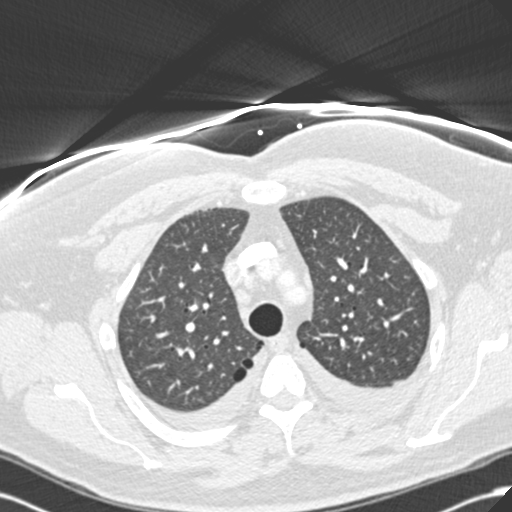
[im 251/321  soft-tissue]
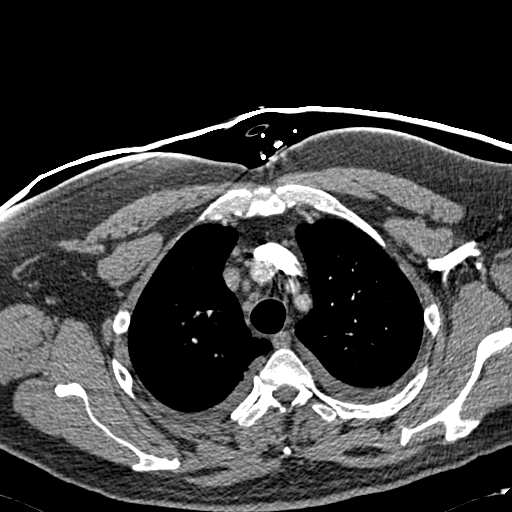
[im 279/321  lung]
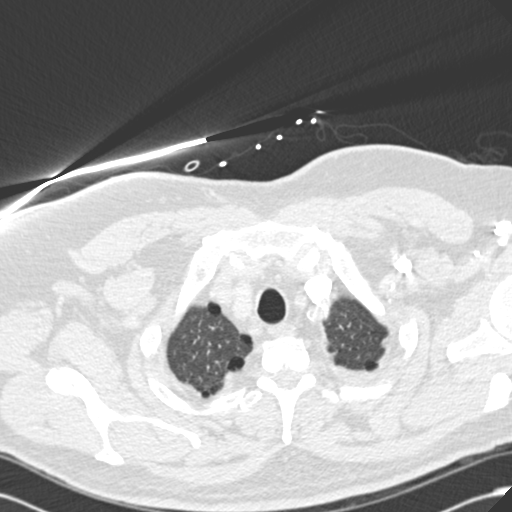
[im 293/321  soft-tissue]
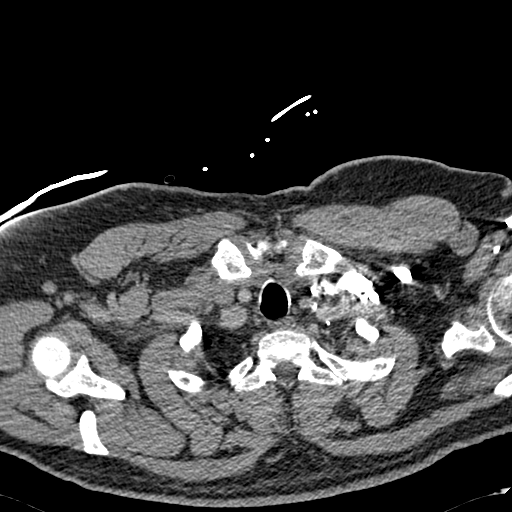
[im 307/321  lung]
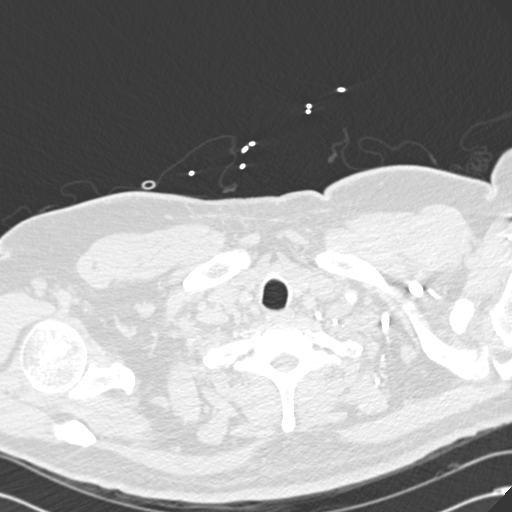

[19 of 32 positions shown; findings below may reference images not displayed]

FINDINGS: Satisfactory opacification of the pulmonary arteries
noted, and there is no evidence of pulmonary emboli. There is no
evidence of thoracic aortic aneurysm or dissection.

A small pleural effusions are seen bilaterally.  Diffuse pulmonary
interstitial prominence and mild ground-glass pulmonary opacity is
suspicious for mild pulmonary edema.  Shotty mediastinal and hilar
lymph nodes are seen, which are likely secondary to lymphedema.

Review of the MIP images confirms the above findings.
IMPRESSION: 1.  No evidence of pulmonary embolism.
2.  Mild congestive heart failure with small bilateral pleural
effusions.

## 2011-06-07 ENCOUNTER — Other Ambulatory Visit: Payer: Self-pay | Admitting: Cardiology

## 2011-06-19 ENCOUNTER — Ambulatory Visit (INDEPENDENT_AMBULATORY_CARE_PROVIDER_SITE_OTHER): Payer: 59 | Admitting: Cardiology

## 2011-06-19 ENCOUNTER — Encounter: Payer: Self-pay | Admitting: Cardiology

## 2011-06-19 ENCOUNTER — Other Ambulatory Visit: Payer: Self-pay | Admitting: Cardiology

## 2011-06-19 VITALS — BP 122/82 | HR 71 | Ht 69.5 in | Wt 260.0 lb

## 2011-06-19 DIAGNOSIS — I10 Essential (primary) hypertension: Secondary | ICD-10-CM

## 2011-06-19 DIAGNOSIS — E669 Obesity, unspecified: Secondary | ICD-10-CM

## 2011-06-19 DIAGNOSIS — Z720 Tobacco use: Secondary | ICD-10-CM

## 2011-06-19 DIAGNOSIS — E785 Hyperlipidemia, unspecified: Secondary | ICD-10-CM

## 2011-06-19 DIAGNOSIS — F17200 Nicotine dependence, unspecified, uncomplicated: Secondary | ICD-10-CM

## 2011-06-19 DIAGNOSIS — I472 Ventricular tachycardia: Secondary | ICD-10-CM

## 2011-06-19 DIAGNOSIS — G4733 Obstructive sleep apnea (adult) (pediatric): Secondary | ICD-10-CM

## 2011-06-19 DIAGNOSIS — I5042 Chronic combined systolic (congestive) and diastolic (congestive) heart failure: Secondary | ICD-10-CM

## 2011-06-19 NOTE — Assessment & Plan Note (Signed)
Needs followup labs with his primary care. He was advised to do so.

## 2011-06-19 NOTE — Assessment & Plan Note (Signed)
Well-controlled. Continue current medical therapy. Encouraged to lose weight and quit smoking.

## 2011-06-19 NOTE — Assessment & Plan Note (Signed)
His weight and smoking still weigh heavily against him and his heart failure. I reminded him of this. He is compliant with his medications and his blood pressures under good control. He is relatively asymptomatic. We'll repeat echocardiogram since the last one was in 2011. His ejection fraction at that time was 45-50%. Follow up cardiac wise in a year.

## 2011-06-19 NOTE — Progress Notes (Signed)
HPI Today for evaluation and management of his chronic combined congestive heart failure. His blood pressures been under good control. He's walking about a mile and a half every day. His weight is unchanged. He still smokes about a pack of cigarettes a day.  He denies any angina, orthopnea, PND or edema.  He is overdue for followup with his primary care and is probably due a colonoscopy. I reminded him to do this as soon as possible. He also needs annual blood work.  Past Medical History  Diagnosis Date  . Acute on chronic combined systolic and diastolic heart failure   . Other and unspecified hyperlipidemia   . Other abnormal glucose   . Unspecified essential hypertension   . Leukocytosis, unspecified   . Obesity, unspecified   . Obstructive sleep apnea (adult) (pediatric)   . Ventricular tachycardia, inducible   . Arrhythmia     Current Outpatient Prescriptions  Medication Sig Dispense Refill  . carvedilol (COREG) 3.125 MG tablet TAKE 1 TABLET BY MOUTH TWICE A DAY  60 tablet  11  . furosemide (LASIX) 40 MG tablet TAKE 1 TABLET BY MOUTH EVERY DAY  30 tablet  11  . lisinopril (PRINIVIL,ZESTRIL) 10 MG tablet TAKE 1 TABLET BY MOUTH EVERY DAY  30 tablet  11  . simvastatin (ZOCOR) 20 MG tablet TAKE 1 TABLET BY MOUTH AT BEDTIME  30 tablet  11    No Known Allergies  Family History  Problem Relation Age of Onset  . Lung cancer Maternal Grandfather   . Ovarian cancer Mother     History   Social History  . Marital Status: Married    Spouse Name: N/A    Number of Children: N/A  . Years of Education: N/A   Occupational History  . Not on file.   Social History Main Topics  . Smoking status: Current Everyday Smoker  . Smokeless tobacco: Not on file  . Alcohol Use: Yes  . Drug Use: No  . Sexually Active: Not on file   Other Topics Concern  . Not on file   Social History Narrative  . No narrative on file    ROS ALL NEGATIVE EXCEPT THOSE NOTED IN HPI  PE  General  Appearance: well developed, well nourished in no acute distress, obese HEENT: symmetrical face, PERRLA, good dentition  Neck: no JVD, thyromegaly, or adenopathy, trachea midline Chest: symmetric without deformity Cardiac: PMI non-displaced, RRR, normal S1, S2, no gallop or murmur Lung: clear to ausculation and percussion Vascular: all pulses full without bruits  Abdominal: nondistended, nontender, good bowel sounds, no HSM, no bruits Extremities: no cyanosis, clubbing or edema, no sign of DVT, no varicosities  Skin: normal color, no rashes Neuro: alert and oriented x 3, non-focal Pysch: normal affect  EKG Normal sinus rhythm, occasional PVC BMET    Component Value Date/Time   NA 137 03/02/2009 1123   K 5.2* 03/02/2009 1123   CL 100 03/02/2009 1123   CO2 31 03/02/2009 1123   GLUCOSE 89 03/02/2009 1123   BUN 33* 03/02/2009 1123   CREATININE 1.4 03/02/2009 1123   CALCIUM 9.4 03/02/2009 1123   GFRNONAA 55.23 03/02/2009 1123   GFRAA  Value: >60        The eGFR has been calculated using the MDRD equation. This calculation has not been validated in all clinical situations. eGFR's persistently <60 mL/min signify possible Chronic Kidney Disease. 02/16/2009 0520    Lipid Panel     Component Value Date/Time   CHOL  Value: 189        ATP III CLASSIFICATION:  <200     mg/dL   Desirable  409-811  mg/dL   Borderline High  >=914    mg/dL   High        7/82/9562 0520   TRIG 93 02/16/2009 0520   HDL 38* 02/16/2009 0520   CHOLHDL 5.0 02/16/2009 0520   VLDL 19 02/16/2009 0520   LDLCALC  Value: 132        Total Cholesterol/HDL:CHD Risk Coronary Heart Disease Risk Table                     Men   Women  1/2 Average Risk   3.4   3.3  Average Risk       5.0   4.4  2 X Average Risk   9.6   7.1  3 X Average Risk  23.4   11.0        Use the calculated Patient Ratio above and the CHD Risk Table to determine the patient's CHD Risk.        ATP III CLASSIFICATION (LDL):  <100     mg/dL   Optimal  130-865  mg/dL   Near or  Above                    Optimal  130-159  mg/dL   Borderline  784-696  mg/dL   High  >295     mg/dL   Very High* 2/84/1324 0520    CBC    Component Value Date/Time   WBC 19.6* 02/16/2009 0520   RBC 5.02 02/16/2009 0520   HGB 14.9 02/16/2009 0520   HCT 44.7 02/16/2009 0520   PLT 149* 02/16/2009 0520   MCV 89.0 02/16/2009 0520   MCHC 33.3 02/16/2009 0520   RDW 14.4 02/16/2009 0520   LYMPHSABS 0.6* 02/15/2009 0455   MONOABS 0.1 02/15/2009 0455   EOSABS 0.0 02/15/2009 0455   BASOSABS 0.1 02/15/2009 0455

## 2011-06-19 NOTE — Patient Instructions (Signed)
Your physician has requested that you have an echocardiogram. Echocardiography is a painless test that uses sound waves to create images of your heart. It provides your doctor with information about the size and shape of your heart and how well your heart's chambers and valves are working. This procedure takes approximately one hour. There are no restrictions for this procedure.  Your physician recommends that you follow-up with your primary care doctor regularly  Your physician recommends that you continue on your current medications as directed. Please refer to the Current Medication list given to you  Today.  Your physician discussed the hazards of tobacco use. Tobacco use cessation is recommended and techniques and options to help you quit were discussed.

## 2011-06-19 NOTE — Assessment & Plan Note (Signed)
Advised to quit.  

## 2011-06-24 ENCOUNTER — Ambulatory Visit (HOSPITAL_COMMUNITY): Payer: 59 | Attending: Cardiology

## 2011-06-24 DIAGNOSIS — I5042 Chronic combined systolic (congestive) and diastolic (congestive) heart failure: Secondary | ICD-10-CM

## 2011-06-24 DIAGNOSIS — I10 Essential (primary) hypertension: Secondary | ICD-10-CM | POA: Insufficient documentation

## 2011-06-24 DIAGNOSIS — E669 Obesity, unspecified: Secondary | ICD-10-CM | POA: Insufficient documentation

## 2011-06-24 DIAGNOSIS — F17200 Nicotine dependence, unspecified, uncomplicated: Secondary | ICD-10-CM | POA: Insufficient documentation

## 2011-06-24 DIAGNOSIS — I498 Other specified cardiac arrhythmias: Secondary | ICD-10-CM | POA: Insufficient documentation

## 2011-06-24 DIAGNOSIS — I509 Heart failure, unspecified: Secondary | ICD-10-CM

## 2011-06-24 DIAGNOSIS — E785 Hyperlipidemia, unspecified: Secondary | ICD-10-CM | POA: Insufficient documentation

## 2011-06-24 DIAGNOSIS — G4733 Obstructive sleep apnea (adult) (pediatric): Secondary | ICD-10-CM | POA: Insufficient documentation

## 2011-06-24 NOTE — Progress Notes (Signed)
Echocardiogram performed.  

## 2011-06-26 ENCOUNTER — Other Ambulatory Visit: Payer: Self-pay | Admitting: Family Medicine

## 2011-06-26 DIAGNOSIS — Z0000 Encounter for general adult medical examination without abnormal findings: Secondary | ICD-10-CM

## 2011-06-27 ENCOUNTER — Other Ambulatory Visit: Payer: 59

## 2011-06-27 ENCOUNTER — Other Ambulatory Visit (INDEPENDENT_AMBULATORY_CARE_PROVIDER_SITE_OTHER): Payer: 59

## 2011-06-27 DIAGNOSIS — Z0000 Encounter for general adult medical examination without abnormal findings: Secondary | ICD-10-CM

## 2011-06-27 LAB — CBC WITH DIFFERENTIAL/PLATELET
Basophils Relative: 0.1 % (ref 0.0–3.0)
Eosinophils Absolute: 0.2 10*3/uL (ref 0.0–0.7)
Hemoglobin: 16.1 g/dL (ref 13.0–17.0)
Lymphocytes Relative: 24.8 % (ref 12.0–46.0)
MCHC: 33.5 g/dL (ref 30.0–36.0)
MCV: 88.9 fl (ref 78.0–100.0)
Neutro Abs: 6.1 10*3/uL (ref 1.4–7.7)
RBC: 5.42 Mil/uL (ref 4.22–5.81)

## 2011-06-27 LAB — HEPATIC FUNCTION PANEL
ALT: 20 U/L (ref 0–53)
Albumin: 4 g/dL (ref 3.5–5.2)
Alkaline Phosphatase: 56 U/L (ref 39–117)
Total Protein: 7.2 g/dL (ref 6.0–8.3)

## 2011-06-27 LAB — LIPID PANEL: HDL: 29.2 mg/dL — ABNORMAL LOW (ref >39.00)

## 2011-06-27 LAB — PSA: PSA: 0.4 ng/mL (ref 0.10–4.00)

## 2011-06-27 LAB — BASIC METABOLIC PANEL
CO2: 29 mEq/L (ref 19–32)
Chloride: 105 mEq/L (ref 96–112)
Sodium: 141 mEq/L (ref 135–145)

## 2011-06-27 LAB — TSH: TSH: 1.98 u[IU]/mL (ref 0.35–5.50)

## 2011-07-03 ENCOUNTER — Ambulatory Visit (INDEPENDENT_AMBULATORY_CARE_PROVIDER_SITE_OTHER): Payer: 59 | Admitting: Family Medicine

## 2011-07-03 ENCOUNTER — Encounter: Payer: Self-pay | Admitting: Family Medicine

## 2011-07-03 VITALS — BP 82/40 | HR 60 | Temp 98.2°F | Resp 12 | Ht 69.5 in | Wt 262.0 lb

## 2011-07-03 DIAGNOSIS — Z23 Encounter for immunization: Secondary | ICD-10-CM

## 2011-07-03 DIAGNOSIS — Z0000 Encounter for general adult medical examination without abnormal findings: Secondary | ICD-10-CM

## 2011-07-03 NOTE — Patient Instructions (Addendum)
Check on insurance coverage for shingles vaccine and consider scheduling as covered Check on previous colonoscopy screening regarding provider and date

## 2011-07-03 NOTE — Progress Notes (Signed)
Subjective:    Patient ID: Wesley Castro, male    DOB: 1950/10/13, 61 y.o.   MRN: [id number]  HPI  Complete physical. Patient's history of obesity, systolic and diastolic heart failure, hypertension, dyslipidemia, ongoing nicotine use, obstructive sleep apnea. Recently saw cardiologist. Recent echo revealed LVH which is chronic. Patient has been compliant with medications including lisinopril, coreg, furosemide, and simvastatin.  Last colonoscopy about 8 years ago. He apparently had polyp removed was told needed 5 year followup but had some heart issues has not been back since. No history of Pneumovax. Last tetanus about 4 years ago. No history of shingles vaccine.  Was prescribed CPAP at one point but has refused to wear. Generally feels well rested. No recent chest pains.  Past Medical History  Diagnosis Date  . Acute on chronic combined systolic and diastolic heart failure   . Other and unspecified hyperlipidemia   . Other abnormal glucose   . Unspecified essential hypertension   . Leukocytosis, unspecified   . Obesity, unspecified   . Obstructive sleep apnea (adult) (pediatric)   . Paroxysmal ventricular tachycardia   . Arrhythmia    No past surgical history on file.  reports that he has been smoking.  He does not have any smokeless tobacco history on file. He reports that he drinks alcohol. He reports that he does not use illicit drugs. family history includes Cancer (age of onset:43) in his mother; Lung cancer in his maternal grandfather; and Ovarian cancer in his mother. No Known Allergies    Review of Systems  Constitutional: Negative for fever, activity change, appetite change and fatigue.  HENT: Negative for ear pain, congestion and trouble swallowing.   Eyes: Negative for pain and visual disturbance.  Respiratory: Negative for cough, shortness of breath and wheezing.   Cardiovascular: Negative for chest pain and palpitations.  Gastrointestinal: Negative for nausea,  vomiting, abdominal pain, diarrhea, constipation, blood in stool, abdominal distention and rectal pain.  Genitourinary: Negative for dysuria, hematuria and testicular pain.  Musculoskeletal: Negative for joint swelling and arthralgias.  Skin: Negative for rash.  Neurological: Negative for dizziness, syncope and headaches.  Hematological: Negative for adenopathy.  Psychiatric/Behavioral: Negative for confusion and dysphoric mood.       Objective:   Physical Exam  Constitutional: He is oriented to person, place, and time. He appears well-developed and well-nourished. No distress.  HENT:  Head: Normocephalic and atraumatic.  Right Ear: External ear normal.  Left Ear: External ear normal.  Mouth/Throat: Oropharynx is clear and moist.  Eyes: Conjunctivae and EOM are normal. Pupils are equal, round, and reactive to light.  Neck: Normal range of motion. Neck supple. No thyromegaly present.  Cardiovascular: Normal rate, regular rhythm and normal heart sounds.   No murmur heard. Pulmonary/Chest: No respiratory distress. He has no wheezes. He has no rales.  Abdominal: Soft. Bowel sounds are normal. He exhibits no distension and no mass. There is no tenderness. There is no rebound and no guarding.  Musculoskeletal: He exhibits no edema.  Lymphadenopathy:    He has no cervical adenopathy.  Neurological: He is alert and oriented to person, place, and time. He displays normal reflexes. No cranial nerve deficit.  Skin: No rash noted.  Psychiatric: He has a normal mood and affect.          Assessment & Plan:  Complete physical. Patient is encouraged to followup for colonoscopy screening which is overdue. Pneumovax given. Check on insurance coverage for shingles vaccine. Smoking cessation discussed and low motivation. Labs  reviewed with patient.

## 2011-11-04 ENCOUNTER — Telehealth: Payer: Self-pay | Admitting: *Deleted

## 2011-11-04 NOTE — Telephone Encounter (Signed)
Reviewed pt chart with Dr Tawanna Cooler, OK verbally given for IV sedation.  Form faxed, confirmation received, will scan form to pt chart, wife informed.

## 2011-11-04 NOTE — Telephone Encounter (Signed)
Pt brought to office a form last week for Dr Caryl Never to fill out for dental procedure.  Form no where to be found.Jill Side remembers personally giving it to Dr Caryl Never.  If he filled it out, which I believe he did, the dental office cannot locate it.    The patient needs form filled out prior to procedure that needs to be done this week, extractions, fillings, cleaning.    1.)  current level of sleep apnea, mild, moderate, severe? 2.)  what is his level of COPD 3.)  I pt a candidate for IV conscious sedation  Would you please look at June CPX and advise me if you will sign form, or if pt needs OV

## 2011-11-05 ENCOUNTER — Telehealth: Payer: Self-pay | Admitting: Cardiology

## 2011-11-05 NOTE — Telephone Encounter (Signed)
New problem:  Upcoming dental appt.  Please advise on iv sedation office will be faxing over form. - extraction, filling, cleaning.

## 2011-11-05 NOTE — Telephone Encounter (Signed)
Spoke with tracy from pt's dentist office regarding a form faxed to this office for clearance for IV conscious sedation.  Pt was  recently was diagnose with sleep apnea. Patient is having two teeth extraction next week, and will need IV sedation clearance.

## 2011-11-08 NOTE — Telephone Encounter (Signed)
Dr. Daleen Squibb has reviewed and pt is cleared for dental extraction under conscious sedation LMOVM for Tracey and have faxed clearance form provided by their office.  Mylo Red RN

## 2011-11-21 NOTE — Telephone Encounter (Signed)
I spoke with [name] who states she did not receive the clearance for conscious sedation. Transmission was marked as complete when I faxed the verbal order from Dr. Daleen Squibb. [name] states also that Dr. Geoffery Lyons will only accept a handwritten signature from Dr. Daleen Squibb that pt is cleared for dental extraction under conscious sedation. Even though pt has been cleared by Dr. Daleen Squibb with verbal order on 11/08/11 and VOV sent to Dr. Duaine Dredge office.  [name] is aware Dr. Daleen Squibb will be in the office next Thursday to provide a hand signed letter to be faxed to their office. Mylo Red RN

## 2011-11-29 ENCOUNTER — Telehealth: Payer: Self-pay | Admitting: Cardiology

## 2011-11-29 NOTE — Telephone Encounter (Signed)
**Note De-Identified Wesley Castro Obfuscation** [name] is advised to contact pt's pulmonologist, Dr. Shelle Iron, concerning sleep apnea and COPD, she verbalized understanding. Question concerning IV sedation has been answered (pt. Is a candidate for IV Sedation per Dr. Daleen Squibb) and form faxed back to Dr. Duaine Dredge, pt's dentist, office at [phone].

## 2011-11-29 NOTE — Telephone Encounter (Signed)
Pt needs to have dental surgery done he has a severe gag reflex. Forms where faxed over a month ago and the form was only signed not filled out so the form needs to be filled out so pt can have surgery.

## 2012-06-26 ENCOUNTER — Other Ambulatory Visit: Payer: Self-pay | Admitting: Cardiology

## 2012-07-02 ENCOUNTER — Telehealth: Payer: Self-pay | Admitting: Cardiology

## 2012-07-02 NOTE — Telephone Encounter (Signed)
New Prob      Would like to know when pt needs to come back in to keep up with refills. Please call.

## 2012-07-02 NOTE — Telephone Encounter (Signed)
Pt states he needs furosemide refilled wants to also know who his new doctor will be in order to obtain refills

## 2012-07-02 NOTE — Telephone Encounter (Signed)
Pt's wife called to have a prescription for Furosemide send to pharmacy. A Prescription was called for Furosemide 40 mg once a day, dispense # 30 pills with 3 refills. Pt and wife would like to know which doctor pt is going to be assigned to, because pt needs to make an appointment to be seen . Pt's last office visit with Dr. Daleen Squibb was 06/19/11.

## 2012-09-01 ENCOUNTER — Telehealth: Payer: Self-pay | Admitting: Family Medicine

## 2012-09-01 NOTE — Telephone Encounter (Signed)
Pt's cardiologist is no longer going to be seeing pt's (Dr Daleen Squibb) Pt would like DR Burchette to recommend one. Pls advise.

## 2012-09-01 NOTE — Telephone Encounter (Signed)
Dr Peter [place] or Dr Angelina Sheriff.

## 2012-09-01 NOTE — Telephone Encounter (Signed)
Left message for pt to return call.

## 2012-09-10 NOTE — Telephone Encounter (Signed)
Pt aware per Dr. Burchette 

## 2012-09-16 ENCOUNTER — Other Ambulatory Visit (INDEPENDENT_AMBULATORY_CARE_PROVIDER_SITE_OTHER): Payer: 59

## 2012-09-16 DIAGNOSIS — Z0000 Encounter for general adult medical examination without abnormal findings: Secondary | ICD-10-CM

## 2012-09-16 LAB — CBC WITH DIFFERENTIAL/PLATELET
Basophils Absolute: 0.1 10*3/uL (ref 0.0–0.1)
Eosinophils Absolute: 0.2 10*3/uL (ref 0.0–0.7)
MCHC: 34.1 g/dL (ref 30.0–36.0)
MCV: 88.2 fl (ref 78.0–100.0)
Monocytes Absolute: 0.8 10*3/uL (ref 0.1–1.0)
Neutrophils Relative %: 58 % (ref 43.0–77.0)
Platelets: 166 10*3/uL (ref 150.0–400.0)
RDW: 13.8 % (ref 11.5–14.6)
WBC: 8.4 10*3/uL (ref 4.5–10.5)

## 2012-09-16 LAB — POCT URINALYSIS DIPSTICK
Bilirubin, UA: NEGATIVE
Glucose, UA: NEGATIVE
Leukocytes, UA: NEGATIVE
Nitrite, UA: NEGATIVE
Urobilinogen, UA: 1

## 2012-09-17 LAB — BASIC METABOLIC PANEL
BUN: 15 mg/dL (ref 6–23)
Chloride: 101 mEq/L (ref 96–112)
Creatinine, Ser: 1.1 mg/dL (ref 0.4–1.5)

## 2012-09-17 LAB — HEPATIC FUNCTION PANEL
Bilirubin, Direct: 0.2 mg/dL (ref 0.0–0.3)
Total Bilirubin: 0.6 mg/dL (ref 0.3–1.2)

## 2012-09-17 LAB — LIPID PANEL
HDL: 28.7 mg/dL — ABNORMAL LOW (ref >39.00)
Triglycerides: 138 mg/dL (ref 0.0–149.0)

## 2012-09-17 LAB — PSA: PSA: 0.34 ng/mL (ref 0.10–4.00)

## 2012-09-28 ENCOUNTER — Ambulatory Visit (INDEPENDENT_AMBULATORY_CARE_PROVIDER_SITE_OTHER): Payer: 59 | Admitting: Family Medicine

## 2012-09-28 ENCOUNTER — Encounter: Payer: Self-pay | Admitting: Family Medicine

## 2012-09-28 VITALS — BP 112/62 | HR 75 | Temp 98.0°F | Ht 69.0 in | Wt 274.0 lb

## 2012-09-28 DIAGNOSIS — Z0000 Encounter for general adult medical examination without abnormal findings: Secondary | ICD-10-CM

## 2012-09-28 DIAGNOSIS — R7309 Other abnormal glucose: Secondary | ICD-10-CM

## 2012-09-28 DIAGNOSIS — R739 Hyperglycemia, unspecified: Secondary | ICD-10-CM

## 2012-09-28 NOTE — Progress Notes (Signed)
Subjective:    Patient ID: Wesley Castro, male    DOB: 01/13/1951, 62 y.o.   MRN: [id number]  HPI Patient here for complete physical History of hypertension, hyperlipidemia, ongoing nicotine use, obesity, obstructive sleep apnea, chronic combined systolic and diastolic heart failure. He has poor compliance with diet. He is not exercising regularly. He smokes about one pack cigarettes per day. Medications are reviewed and he is compliant with all. Denies any recent chest pains. No dyspnea at rest.  Tetanus and Pneumovax are up to date. No history of shingles vaccine. He is overdue for repeat colonoscopy.  Past Medical History  Diagnosis Date  . Acute on chronic combined systolic and diastolic heart failure   . Other and unspecified hyperlipidemia   . Other abnormal glucose   . Unspecified essential hypertension   . Leukocytosis, unspecified   . Obesity, unspecified   . Obstructive sleep apnea (adult) (pediatric)   . Paroxysmal ventricular tachycardia   . Arrhythmia    No past surgical history on file.  reports that he has been smoking.  He does not have any smokeless tobacco history on file. He reports that  drinks alcohol. He reports that he does not use illicit drugs. family history includes Cancer (age of onset: 10) in his mother; Lung cancer in his maternal grandfather; Ovarian cancer in his mother. No Known Allergies    Review of Systems  Constitutional: Positive for fatigue. Negative for fever, chills, activity change, appetite change and unexpected weight change.  HENT: Negative for ear pain, congestion and trouble swallowing.   Eyes: Negative for pain and visual disturbance.  Respiratory: Negative for cough, shortness of breath and wheezing.   Cardiovascular: Negative for chest pain and palpitations.  Gastrointestinal: Negative for nausea, vomiting, abdominal pain, diarrhea, constipation, blood in stool, abdominal distention and rectal pain.  Endocrine: Negative for cold  intolerance, heat intolerance, polydipsia and polyphagia.  Genitourinary: Negative for dysuria, hematuria and testicular pain.  Musculoskeletal: Negative for joint swelling and arthralgias.  Skin: Negative for rash.  Neurological: Negative for dizziness, syncope and headaches.  Hematological: Negative for adenopathy.  Psychiatric/Behavioral: Negative for confusion and dysphoric mood.       Objective:   Physical Exam  Constitutional: He is oriented to person, place, and time. He appears well-developed and well-nourished. No distress.  HENT:  Head: Normocephalic and atraumatic.  Right Ear: External ear normal.  Left Ear: External ear normal.  Mouth/Throat: Oropharynx is clear and moist.  Eyes: Conjunctivae and EOM are normal. Pupils are equal, round, and reactive to light.  Neck: Normal range of motion. Neck supple. No thyromegaly present.  No carotid bruits  Cardiovascular: Normal rate, regular rhythm and normal heart sounds.   No murmur heard. Pulmonary/Chest: No respiratory distress. He has no wheezes. He has no rales.  Abdominal: Soft. Bowel sounds are normal. He exhibits no distension and no mass. There is no tenderness. There is no rebound and no guarding.  Musculoskeletal: He exhibits no edema.  Lymphadenopathy:    He has no cervical adenopathy.  Neurological: He is alert and oriented to person, place, and time. He displays normal reflexes. No cranial nerve deficit.  Skin: No rash noted.  Psychiatric: He has a normal mood and affect.       Assessment & Plan:  Complete physical. Tetanus up-to-date. Pneumovax up-to-date. Reminder for yearly flu vaccine. Check on insurance coverage for shingles vaccine. He will give [place] name of previous gastroenterologist so we can set up followup colonoscopy. We discussed the importance  of weight loss and gave some suggestions for strategies. Needs to quit smoking.  Hyperglycemia. No prior history of diabetes. Work on weight loss. Return for  fasting glucose and A1c in 2-3 weeks

## 2012-10-06 ENCOUNTER — Encounter: Payer: Self-pay | Admitting: *Deleted

## 2012-10-15 ENCOUNTER — Ambulatory Visit (INDEPENDENT_AMBULATORY_CARE_PROVIDER_SITE_OTHER): Payer: 59 | Admitting: Cardiology

## 2012-10-15 ENCOUNTER — Encounter: Payer: Self-pay | Admitting: Cardiology

## 2012-10-15 VITALS — BP 130/72 | HR 82 | Ht 69.0 in | Wt 277.0 lb

## 2012-10-15 DIAGNOSIS — I5041 Acute combined systolic (congestive) and diastolic (congestive) heart failure: Secondary | ICD-10-CM

## 2012-10-15 MED ORDER — LISINOPRIL 10 MG PO TABS: 10.0000 mg | ORAL_TABLET | Freq: Every day | ORAL | Status: DC

## 2012-10-15 MED ORDER — FUROSEMIDE 40 MG PO TABS: 40.0000 mg | ORAL_TABLET | Freq: Every day | ORAL | Status: DC

## 2012-10-15 MED ORDER — SIMVASTATIN 20 MG PO TABS: 20.0000 mg | ORAL_TABLET | Freq: Every day | ORAL | Status: DC

## 2012-10-15 MED ORDER — CARVEDILOL 3.125 MG PO TABS: 3.1250 mg | ORAL_TABLET | Freq: Two times a day (BID) | ORAL | Status: DC

## 2012-10-15 NOTE — Progress Notes (Signed)
Patient ID: Wesley Castro, male   DOB: [birth year], 62 y.o.   MRN: [id number]    Patient Name: Wesley Castro Date of Encounter: 10/15/2012  Primary Care Provider:  Kristian Covey, MD Primary Cardiologist:  Tobias Alexander, H   Patient Profile Combined systolic and diastolic CHF, 1 year follop up  Problem List   Past Medical History  Diagnosis Date  . Acute on chronic combined systolic and diastolic heart failure   . Other and unspecified hyperlipidemia   . Other abnormal glucose   . Unspecified essential hypertension   . Leukocytosis, unspecified   . Obesity, unspecified   . Obstructive sleep apnea (adult) (pediatric)   . Paroxysmal ventricular tachycardia   . Arrhythmia    No past surgical history on file.  Allergies  No Known Allergies  HPI  62 year old male previously followed by Dr Daleen Squibb who is coming for a follow up after 1 year. The patient was diagnosed with combined systolic and diastolic heart failure in 2011 and started on ACEI and BB. He has been stable since then.  Since the last visit he denies any chest pain, SOB, PND, orthopnea. He has frequent PVCs that he is aware of. No syncope or dizziness. No weight change, no LE edema. He walks over 2 miles daily and has no symptoms.  Home Medications  Prior to Admission medications   Medication Sig Start Date End Date Taking? Authorizing Provider  aspirin 81 MG tablet Take 81 mg by mouth daily.   Yes Historical Provider, MD  carvedilol (COREG) 3.125 MG tablet TAKE 1 TABLET BY MOUTH TWICE A DAY 06/26/12  Yes Gaylord Shih, MD  furosemide (LASIX) 40 MG tablet TAKE 1 TABLET BY MOUTH EVERY DAY 06/26/12  Yes Gaylord Shih, MD  lisinopril (PRINIVIL,ZESTRIL) 10 MG tablet TAKE 1 TABLET BY MOUTH EVERY DAY 06/26/12  Yes Gaylord Shih, MD  simvastatin (ZOCOR) 20 MG tablet TAKE 1 TABLET BY MOUTH AT BEDTIME 06/26/12  Yes Gaylord Shih, MD    Family History  Family History  Problem Relation Age of Onset  . Lung cancer Maternal  Grandfather   . Ovarian cancer Mother 4    Social History  History   Social History  . Marital Status: Married    Spouse Name: N/A    Number of Children: N/A  . Years of Education: N/A   Occupational History  . Not on file.   Social History Main Topics  . Smoking status: Current Every Day Smoker    Last Attempt to Quit: 02/05/2009  . Smokeless tobacco: Not on file     Comment: quit Jan 2011  . Alcohol Use: Yes  . Drug Use: No  . Sexual Activity: Not on file   Other Topics Concern  . Not on file   Social History Narrative  . No narrative on file     Review of Systems General:  No chills, fever, night sweats or weight changes.  Cardiovascular:  No chest pain, dyspnea on exertion, edema, orthopnea, palpitations, paroxysmal nocturnal dyspnea. Dermatological: No rash, lesions/masses Respiratory: No cough, dyspnea Urologic: No hematuria, dysuria Abdominal:   No nausea, vomiting, diarrhea, bright red blood per rectum, melena, or hematemesis Neurologic:  No visual changes, wkns, changes in mental status. All other systems reviewed and are otherwise negative except as noted above.  Physical Exam  Blood pressure 130/72, pulse 82, height 5\' 9"  (1.753 m), weight 277 lb (125.646 kg).  General: Pleasant, NAD Psych: Normal affect. Neuro: Alert and  oriented X 3. Moves all extremities spontaneously. Obese. HEENT: Normal  Neck: Supple without bruits or JVD. Lungs:  Resp regular and unlabored, CTA. Heart: RRR no s3, s4, or murmurs. Abdomen: Soft, non-tender, non-distended, BS + x 4.  Extremities: No clubbing, cyanosis or edema. DP/PT/Radials 2+ and equal bilaterally.  Accessory Clinical Findings  TTE 06/24/2011 Left ventricle: The cavity size was normal. Wall thickness was increased in a pattern of moderate LVH. Systolic function was mildly reduced. The estimated ejection fraction was in the range of 45% to 50%. Regional wall motion abnormalities: Moderate hypokinesis of  the inferolateral myocardium.  ECG - SR, PVCs, otherwise normal  Assessment & Plan  62 year old male   1. Combined systolic and diastolic CHF - stable, continue same regimen with Lisinopril, Carvedilol, Lasix  2. Hyperlipidemia - controlled, recently checked by PCP, continue Simvastatin  3. We will refill all meds  Follow up in 1 year   Tobias Alexander, Rexene Edison, MD 10/15/2012, 4:27 PM

## 2012-10-15 NOTE — Patient Instructions (Addendum)
Your physician wants you to follow-up in: one year with Dr. Delton See. You will receive a reminder letter in the mail two months in advance. If you don't receive a letter, please call our office to schedule the follow-up appointment.  Your physician recommends that you continue on your current medications as directed. Please refer to the Current Medication list given to you today.

## 2012-10-26 ENCOUNTER — Other Ambulatory Visit: Payer: Self-pay | Admitting: Cardiology

## 2012-12-01 ENCOUNTER — Telehealth: Payer: Self-pay

## 2012-12-01 NOTE — Telephone Encounter (Signed)
patient 's wife called about the lasix mg, she wanted to know if patient waas suppose to take 20 or 40 mg. I told her 40 mg daily.she said that she was going to call the pharm to get it fix

## 2013-02-01 ENCOUNTER — Telehealth: Payer: Self-pay | Admitting: Family Medicine

## 2013-02-01 NOTE — Telephone Encounter (Signed)
Pt son has been sch for today

## 2013-02-01 NOTE — Telephone Encounter (Signed)
OK by me 

## 2013-02-01 NOTE — Telephone Encounter (Signed)
Pt would like for MD to accept his son as new pt. Pt son [name] was seen at [facility] about 4 yrs ago by [employer] [phone])

## 2013-08-05 ENCOUNTER — Emergency Department (HOSPITAL_COMMUNITY)
Admission: EM | Admit: 2013-08-05 | Discharge: 2013-08-05 | Disposition: A | Discharge status: Home / Self Care | Payer: 59 | Source: Home / Self Care | Attending: Family Medicine | Admitting: Family Medicine

## 2013-08-05 ENCOUNTER — Encounter (HOSPITAL_COMMUNITY): Payer: Self-pay | Admitting: Emergency Medicine

## 2013-08-05 ENCOUNTER — Emergency Department (INDEPENDENT_AMBULATORY_CARE_PROVIDER_SITE_OTHER): Payer: 59

## 2013-08-05 DIAGNOSIS — J189 Pneumonia, unspecified organism: Secondary | ICD-10-CM

## 2013-08-05 DIAGNOSIS — F17200 Nicotine dependence, unspecified, uncomplicated: Secondary | ICD-10-CM

## 2013-08-05 DIAGNOSIS — Z720 Tobacco use: Secondary | ICD-10-CM

## 2013-08-05 MED ORDER — AZITHROMYCIN 250 MG PO TABS: 1000.0000 mg | ORAL_TABLET | Freq: Once | ORAL | Status: DC

## 2013-08-05 MED ORDER — DEXAMETHASONE 4 MG PO TABS
ORAL_TABLET | ORAL | Status: AC
  Filled 2013-08-05: qty 2

## 2013-08-05 MED ORDER — DEXAMETHASONE 4 MG PO TABS
8.0000 mg | ORAL_TABLET | Freq: Once | ORAL | Status: AC
  Administered 2013-08-05: 8 mg via ORAL

## 2013-08-05 MED ORDER — CEFTRIAXONE SODIUM 250 MG IJ SOLR: 250.0000 mg | Freq: Once | INTRAMUSCULAR | Status: DC

## 2013-08-05 MED ORDER — AZITHROMYCIN 250 MG PO TABS: ORAL_TABLET | ORAL | Status: DC

## 2013-08-05 NOTE — Discharge Instructions (Signed)
The cause of your coughing is not clear. This may be from a "walking Pneumonia", sinus drainage, inflammation, or other lung abnormality.  The steroids given you today will help with inflammation Please take the azithro for the infection Please go see you regular doctor for a follow up in 4 weeks for a possible CT scan of the chest.  Your smoking puts you at risk for COPD and possible cancer.  Please start using intranasal saline during the day and flonase at night.

## 2013-08-05 NOTE — ED Provider Notes (Signed)
CSN: [id number]     Arrival date & time 08/05/13  1448 History   None    Chief Complaint  Patient presents with  . Cough   (Consider location/radiation/quality/duration/timing/severity/associated sxs/prior Treatment) HPI  Cough: started 2 wks ago. Initially started as a sore throat. Went on cruise  adn sore throat went away. Developed worsening cough and congestion. Dry hacky cough. Still w/ sinus drainage and fullness. Denies HA, fever, CP, SOB, n/v, rash. No h/o allergies. No recent changes in medications. Selsun 12 hr cough syrup and delsym w/o much benefit.  Current long time smoker. Deneis fevers, unintentional wt loss and night sweats  Past Medical History  Diagnosis Date  . Acute on chronic combined systolic and diastolic heart failure   . Other and unspecified hyperlipidemia   . Other abnormal glucose   . Unspecified essential hypertension   . Leukocytosis, unspecified   . Obesity, unspecified   . Obstructive sleep apnea (adult) (pediatric)   . Paroxysmal ventricular tachycardia   . Arrhythmia    History reviewed. No pertinent past surgical history. Family History  Problem Relation Age of Onset  . Lung cancer Maternal Grandfather   . Ovarian cancer Mother 543   History  Substance Use Topics  . Smoking status: Current Every Day Smoker    Last Attempt to Quit: 02/05/2009  . Smokeless tobacco: Not on file     Comment: quit Jan 2011  . Alcohol Use: Yes    Review of Systems Per HPI with all other pertinent systems negative.   Allergies  Review of patient's allergies indicates no known allergies.  Home Medications   Prior to Admission medications   Medication Sig Start Date End Date Taking? Authorizing Provider  carvedilol (COREG) 3.125 MG tablet Take 1 tablet (3.125 mg total) by mouth 2 (two) times daily with a meal. 10/15/12  Yes Lars MassonKatarina H Nelson, MD  furosemide (LASIX) 40 MG tablet TAKE 1 TABLET BY MOUTH EVERY DAY 10/26/12  Yes Lars MassonKatarina H Nelson, MD  lisinopril  (PRINIVIL,ZESTRIL) 10 MG tablet Take 1 tablet (10 mg total) by mouth daily. 10/15/12  Yes Lars MassonKatarina H Nelson, MD  simvastatin (ZOCOR) 20 MG tablet Take 1 tablet (20 mg total) by mouth daily. 10/15/12  Yes Lars MassonKatarina H Nelson, MD  aspirin 81 MG tablet Take 81 mg by mouth daily.    Historical Provider, MD  azithromycin (ZITHROMAX) 250 MG tablet Take 2 tablets then 1 daily thereafter 08/05/13   Ozella Rocksavid J Mayre Bury, MD   BP 111/51  Pulse 57  Temp(Src) 98 F (36.7 C) (Oral)  Resp 16  SpO2 95% Physical Exam  Constitutional: He appears well-developed and well-nourished. No distress.  HENT:  Head: Normocephalic and atraumatic.  Boggy R nasal turbinate  Eyes: EOM are normal. Pupils are equal, round, and reactive to light.  Neck: Normal range of motion.  Cardiovascular: Normal rate, normal heart sounds and intact distal pulses.   No murmur heard. Pulmonary/Chest: Effort normal. No respiratory distress.  Mild R mid lobe ronchi  Abdominal: Soft. He exhibits no distension.  Musculoskeletal: Normal range of motion. He exhibits no edema and no tenderness.  Neurological: He is alert. He exhibits normal muscle tone.  Skin: Skin is warm. No rash noted. He is not diaphoretic.  Psychiatric: He has a normal mood and affect. His behavior is normal. Judgment and thought content normal.    ED Course  Procedures (including critical care time) Labs Review Labs Reviewed - No data to display  Imaging Review Dg Chest 2  View  08/05/2013   CLINICAL DATA:  Smoking history.  Cough and shortness of breath.  EXAM: CHEST  2 VIEW  COMPARISON:  Chest CT 02/14/2009.  Chest radiography 02/14/2009.  FINDINGS: The heart is enlarged. Mediastinal shadows are normal. There are abnormal interstitial lung markings likely to relate to chronic interstitial lung disease. Interstitial edema is not excluded. No effusions. No consolidation or collapse. No significant bony finding.  IMPRESSION: Abnormal interstitial lung markings probably  representing chronic lung disease. Interstitial edema is the other possible explanation.   Electronically Signed   By: Paulina Fusi M.D.   On: 08/05/2013 15:48     MDM   1. Walking pneumonia   2. Tobacco abuse    Cough etiology unclear but may be related to bronchitis vs walking pneumonia vs other pathology including malignancy (40+ pack year smoking hx). Azithro x 5 days Decadron 8mg  in office Start flonase and nasal saline for nasal passage inflammation Counseled on stop smoking  Shelly Flatten, MD Family Medicine 08/05/2013, 4:29 PM      Ozella Rocks, MD 08/05/13 1630

## 2013-08-05 NOTE — ED Notes (Signed)
Pt c/o dry cough x2 weeks Denies cold sx, f/v/n/d Taking OTC cough syrup w/temp relief Alert w/no signs of acute distress.

## 2013-10-28 ENCOUNTER — Other Ambulatory Visit: Payer: Self-pay | Admitting: *Deleted

## 2013-10-28 DIAGNOSIS — I5041 Acute combined systolic (congestive) and diastolic (congestive) heart failure: Secondary | ICD-10-CM

## 2013-10-28 MED ORDER — FUROSEMIDE 40 MG PO TABS: ORAL_TABLET | ORAL | Status: DC

## 2013-10-28 MED ORDER — LISINOPRIL 10 MG PO TABS: 10.0000 mg | ORAL_TABLET | Freq: Every day | ORAL | Status: DC

## 2013-10-28 MED ORDER — SIMVASTATIN 20 MG PO TABS: 20.0000 mg | ORAL_TABLET | Freq: Every day | ORAL | Status: DC

## 2013-10-28 MED ORDER — CARVEDILOL 3.125 MG PO TABS: 3.1250 mg | ORAL_TABLET | Freq: Two times a day (BID) | ORAL | Status: DC

## 2013-11-24 ENCOUNTER — Other Ambulatory Visit: Payer: Self-pay

## 2013-11-24 DIAGNOSIS — I5041 Acute combined systolic (congestive) and diastolic (congestive) heart failure: Secondary | ICD-10-CM

## 2013-11-24 MED ORDER — FUROSEMIDE 40 MG PO TABS: ORAL_TABLET | ORAL | Status: DC

## 2013-11-24 MED ORDER — CARVEDILOL 3.125 MG PO TABS: 3.1250 mg | ORAL_TABLET | Freq: Two times a day (BID) | ORAL | Status: DC

## 2013-11-24 MED ORDER — SIMVASTATIN 20 MG PO TABS: 20.0000 mg | ORAL_TABLET | Freq: Every day | ORAL | Status: DC

## 2013-11-24 MED ORDER — LISINOPRIL 10 MG PO TABS: 10.0000 mg | ORAL_TABLET | Freq: Every day | ORAL | Status: DC

## 2014-01-04 ENCOUNTER — Encounter: Payer: Self-pay | Admitting: Cardiology

## 2014-01-04 ENCOUNTER — Ambulatory Visit (INDEPENDENT_AMBULATORY_CARE_PROVIDER_SITE_OTHER): Payer: 59 | Admitting: Cardiology

## 2014-01-04 VITALS — BP 126/68 | HR 68 | Ht 69.0 in | Wt 275.0 lb

## 2014-01-04 DIAGNOSIS — I5042 Chronic combined systolic (congestive) and diastolic (congestive) heart failure: Secondary | ICD-10-CM

## 2014-01-04 DIAGNOSIS — I10 Essential (primary) hypertension: Secondary | ICD-10-CM

## 2014-01-04 DIAGNOSIS — R9439 Abnormal result of other cardiovascular function study: Secondary | ICD-10-CM

## 2014-01-04 DIAGNOSIS — I5041 Acute combined systolic (congestive) and diastolic (congestive) heart failure: Secondary | ICD-10-CM

## 2014-01-04 DIAGNOSIS — E785 Hyperlipidemia, unspecified: Secondary | ICD-10-CM

## 2014-01-04 MED ORDER — FUROSEMIDE 40 MG PO TABS: ORAL_TABLET | ORAL | Status: DC

## 2014-01-04 MED ORDER — CARVEDILOL 3.125 MG PO TABS: 3.1250 mg | ORAL_TABLET | Freq: Two times a day (BID) | ORAL | Status: DC

## 2014-01-04 MED ORDER — PRAVASTATIN SODIUM 40 MG PO TABS
40.0000 mg | ORAL_TABLET | Freq: Every day | ORAL | Status: DC
Start: 2014-01-04 — End: 2015-01-04

## 2014-01-04 MED ORDER — LISINOPRIL 10 MG PO TABS: 10.0000 mg | ORAL_TABLET | Freq: Every day | ORAL | Status: DC

## 2014-01-04 NOTE — Patient Instructions (Signed)
Your physician wants you to follow-up in: 1 year with Dr. Delton See. You will receive a reminder letter in the mail two months in advance. If you don't receive a letter, please call our office to schedule the follow-up appointment.   Your physician has recommended you make the following change in your medication:  1) START Pravastatin 40 mg once daily  2) STOP Simvastatin 20 mg    Your physician recommends that you return for lab work in: 3 Weeks with ECHO (CMP/Lipid/CBC)  Your physician has requested that you have an echocardiogram. Echocardiography is a painless test that uses sound waves to create images of your heart. It provides your doctor with information about the size and shape of your heart and how well your heart's chambers and valves are working. This procedure takes approximately one hour. There are no restrictions for this procedure.

## 2014-01-04 NOTE — Progress Notes (Signed)
Patient ID: Wesley BarryJames Greenman, male   DOB: [birth year], 63 y.o.   MRN: [id number]     Patient Name: Wesley Castro Date of Encounter: 01/04/2014  Primary Care Provider:  Kristian CoveyBURCHETTE,BRUCE W, MD Primary Cardiologist:  Tobias AlexanderNELSON, Jazzma Neidhardt H   Patient Profile Combined systolic and diastolic CHF, 1 year follop up  Problem List   Past Medical History  Diagnosis Date  . Acute on chronic combined systolic and diastolic heart failure   . Other and unspecified hyperlipidemia   . Other abnormal glucose   . Unspecified essential hypertension   . Leukocytosis, unspecified   . Obesity, unspecified   . Obstructive sleep apnea (adult) (pediatric)   . Paroxysmal ventricular tachycardia   . Arrhythmia    No past surgical history on file.  Allergies  No Known Allergies  HPI  63 year old male previously followed by Dr Daleen SquibbWall who is coming for a follow up after 1 year. The patient was diagnosed with combined systolic and diastolic heart failure in 2011 and started on ACEI and BB. He has been stable since then.  Since the last visit he denies any chest pain, SOB, PND, orthopnea. He has frequent PVCs that he is aware of. No syncope or dizziness. No weight change, no LE edema. He walks over 2 miles daily and has no symptoms.  The patient is coming after one year, he denies any chest pain, he has stable dyspnea on moderate exertion, no lower extremity edema, no paroxysmal nocturnal dyspnea orthopnea palpitation or syncope. He doesn't exercise on a regular basis. He is compliant with his medicines. His only complaint is generalized joint pain that he has had ever since he was started on simvastatin.  Home Medications  Prior to Admission medications   Medication Sig Start Date End Date Taking? Authorizing Provider  aspirin 81 MG tablet Take 81 mg by mouth daily.   Yes Historical Provider, MD  carvedilol (COREG) 3.125 MG tablet TAKE 1 TABLET BY MOUTH TWICE A DAY 06/26/12  Yes Gaylord Shihhomas C Wall, MD  furosemide (LASIX)  40 MG tablet TAKE 1 TABLET BY MOUTH EVERY DAY 06/26/12  Yes Gaylord Shihhomas C Wall, MD  lisinopril (PRINIVIL,ZESTRIL) 10 MG tablet TAKE 1 TABLET BY MOUTH EVERY DAY 06/26/12  Yes Gaylord Shihhomas C Wall, MD  simvastatin (ZOCOR) 20 MG tablet TAKE 1 TABLET BY MOUTH AT BEDTIME 06/26/12  Yes Gaylord Shihhomas C Wall, MD    Family History  Family History  Problem Relation Age of Onset  . Lung cancer Maternal Grandfather   . Ovarian cancer Mother [id number]    Social History  History   Social History  . Marital Status: Married    Spouse Name: N/A    Number of Children: N/A  . Years of Education: N/A   Occupational History  . Not on file.   Social History Main Topics  . Smoking status: Current Every Day Smoker    Last Attempt to Quit: 02/05/2009  . Smokeless tobacco: Not on file     Comment: quit Jan 2011  . Alcohol Use: Yes  . Drug Use: No  . Sexual Activity: Not on file   Other Topics Concern  . Not on file   Social History Narrative     Review of Systems General:  No chills, fever, night sweats or weight changes.  Cardiovascular:  No chest pain, dyspnea on exertion, edema, orthopnea, palpitations, paroxysmal nocturnal dyspnea. Dermatological: No rash, lesions/masses Respiratory: No cough, dyspnea Urologic: No hematuria, dysuria Abdominal:   No nausea, vomiting, diarrhea, bright  red blood per rectum, melena, or hematemesis Neurologic:  No visual changes, wkns, changes in mental status. All other systems reviewed and are otherwise negative except as noted above.  Physical Exam  Blood pressure 126/68, pulse 68, height 5\' 9"  (1.753 m), weight 275 lb (124.739 kg).  General: Pleasant, NAD Psych: Normal affect. Neuro: Alert and oriented X 3. Moves all extremities spontaneously. Obese. HEENT: Normal  Neck: Supple without bruits or JVD. Lungs:  Resp regular and unlabored, CTA. Heart: RRR no s3, s4, or murmurs. Abdomen: Soft, non-tender, non-distended, BS + x 4.  Extremities: No clubbing, cyanosis or edema.  DP/PT/Radials 2+ and equal bilaterally.  Accessory Clinical Findings  TTE 06/24/2011 Left ventricle: The cavity size was normal. Wall thickness was increased in a pattern of moderate LVH. Systolic function was mildly reduced. The estimated ejection fraction was in the range of 45% to 50%. Regional wall motion abnormalities: Moderate hypokinesis of the inferolateral myocardium.  ECG - SR, PVCs, otherwise normal, unchanged from the last year    Assessment & Plan  63 year old male   1. Combined systolic and diastolic CHF - stable, euvolemic, continue same regimen with Lisinopril, Carvedilol, Lasix 40 mg daily orally. We will repeat echocardiogram as he hasn't had one in almost 3 years. We will check creatinine as he is taking Lasix. Crea 1.1 the last year.  2. Hyperlipidemia - controlled, recently checked by PCP, discontinue Simvastatin for generalized muscle pain. Start pravastatin 40 mg daily check CMP, CBC and lipids in 3 weeks.  3. We will refill all meds  Follow up in 1 year   Lars Masson, MD 01/04/2014, 9:13 AM

## 2014-01-31 ENCOUNTER — Ambulatory Visit (HOSPITAL_COMMUNITY): Payer: 59 | Attending: Cardiology | Admitting: Cardiology

## 2014-01-31 ENCOUNTER — Other Ambulatory Visit (INDEPENDENT_AMBULATORY_CARE_PROVIDER_SITE_OTHER): Payer: 59 | Admitting: *Deleted

## 2014-01-31 DIAGNOSIS — I10 Essential (primary) hypertension: Secondary | ICD-10-CM | POA: Insufficient documentation

## 2014-01-31 DIAGNOSIS — Z720 Tobacco use: Secondary | ICD-10-CM | POA: Diagnosis not present

## 2014-01-31 DIAGNOSIS — I340 Nonrheumatic mitral (valve) insufficiency: Secondary | ICD-10-CM | POA: Insufficient documentation

## 2014-01-31 DIAGNOSIS — I5041 Acute combined systolic (congestive) and diastolic (congestive) heart failure: Secondary | ICD-10-CM | POA: Diagnosis present

## 2014-01-31 DIAGNOSIS — E785 Hyperlipidemia, unspecified: Secondary | ICD-10-CM | POA: Insufficient documentation

## 2014-01-31 DIAGNOSIS — E669 Obesity, unspecified: Secondary | ICD-10-CM | POA: Insufficient documentation

## 2014-01-31 LAB — CBC WITH DIFFERENTIAL/PLATELET
Basophils Absolute: 0 10*3/uL (ref 0.0–0.1)
Basophils Relative: 0.4 % (ref 0.0–3.0)
Eosinophils Absolute: 0.3 10*3/uL (ref 0.0–0.7)
Eosinophils Relative: 2.6 % (ref 0.0–5.0)
HCT: 48.5 % (ref 39.0–52.0)
Hemoglobin: 16 g/dL (ref 13.0–17.0)
Lymphocytes Relative: 25.4 % (ref 12.0–46.0)
Lymphs Abs: 2.6 10*3/uL (ref 0.7–4.0)
MCHC: 33 g/dL (ref 30.0–36.0)
MCV: 89.3 fl (ref 78.0–100.0)
Monocytes Absolute: 0.9 10*3/uL (ref 0.1–1.0)
Monocytes Relative: 8.6 % (ref 3.0–12.0)
Neutro Abs: 6.3 10*3/uL (ref 1.4–7.7)
Neutrophils Relative %: 63 % (ref 43.0–77.0)
Platelets: 156 10*3/uL (ref 150.0–400.0)
RBC: 5.44 Mil/uL (ref 4.22–5.81)
RDW: 14.1 % (ref 11.5–15.5)
WBC: 10.1 10*3/uL (ref 4.0–10.5)

## 2014-01-31 LAB — COMPREHENSIVE METABOLIC PANEL
ALT: 20 U/L (ref 0–53)
AST: 26 U/L (ref 0–37)
Albumin: 3.9 g/dL (ref 3.5–5.2)
Alkaline Phosphatase: 54 U/L (ref 39–117)
BUN: 13 mg/dL (ref 6–23)
CO2: 29 mEq/L (ref 19–32)
Calcium: 8.9 mg/dL (ref 8.4–10.5)
Chloride: 102 mEq/L (ref 96–112)
Creatinine, Ser: 1.1 mg/dL (ref 0.4–1.5)
GFR: 74.9 mL/min (ref >60.00)
Glucose, Bld: 141 mg/dL — ABNORMAL HIGH (ref 70–99)
Potassium: 4.1 mEq/L (ref 3.5–5.1)
Sodium: 139 mEq/L (ref 135–145)
Total Bilirubin: 0.9 mg/dL (ref 0.2–1.2)
Total Protein: 7.4 g/dL (ref 6.0–8.3)

## 2014-01-31 LAB — LIPID PANEL
Cholesterol: 137 mg/dL (ref 0–200)
HDL: 25.6 mg/dL — ABNORMAL LOW (ref >39.00)
LDL Cholesterol: 82 mg/dL (ref 0–99)
NonHDL: 111.4
Total CHOL/HDL Ratio: 5
Triglycerides: 149 mg/dL (ref 0.0–149.0)
VLDL: 29.8 mg/dL (ref 0.0–40.0)

## 2014-01-31 NOTE — Progress Notes (Signed)
Echo performed. 

## 2014-02-01 ENCOUNTER — Telehealth: Payer: Self-pay | Admitting: *Deleted

## 2014-02-01 DIAGNOSIS — I5042 Chronic combined systolic (congestive) and diastolic (congestive) heart failure: Secondary | ICD-10-CM

## 2014-02-01 DIAGNOSIS — R931 Abnormal findings on diagnostic imaging of heart and coronary circulation: Secondary | ICD-10-CM

## 2014-02-01 DIAGNOSIS — I10 Essential (primary) hypertension: Secondary | ICD-10-CM

## 2014-02-01 NOTE — Telephone Encounter (Signed)
Called pt and went over instructions for exercise myoview.  Informed pt to hold Lasix and Coreg the morning of test.  Pt stated that he takes these medications at night.  Went over other instructions for test.  Pt verbalized understanding and agreed to plan.

## 2014-02-01 NOTE — Telephone Encounter (Signed)
-----   Message from Lars Masson, MD sent at 02/01/2014  1:05 PM EST ----- His LVEF is worsening, previously 40-45%, now 25-30%. We should schedule an exercise nuclear stress test to rule out ischemia. Thank you

## 2014-02-01 NOTE — Telephone Encounter (Signed)
Notified the pt of his echo results per Dr Delton See, that his EF has worsened from prior 40-45% and now 25-30%.  Informed the pt that per Dr Delton See we need to order a exercise myoview to rule out ischemia.  Briefly went over what this test is, and what it will assess for with the pt.  Informed the pt that I will place the order in the computer and send a message to [facility] to schedule this test for the pt.  Informed the pt that after test is scheduled, I will contact the pt to go over myoview instructions and mail them to him.  Pt verbalized understanding and agrees with this plan.

## 2014-02-07 ENCOUNTER — Ambulatory Visit (HOSPITAL_COMMUNITY): Payer: 59 | Attending: Internal Medicine | Admitting: Radiology

## 2014-02-07 DIAGNOSIS — I472 Ventricular tachycardia: Secondary | ICD-10-CM | POA: Diagnosis not present

## 2014-02-07 DIAGNOSIS — R0609 Other forms of dyspnea: Secondary | ICD-10-CM | POA: Diagnosis not present

## 2014-02-07 DIAGNOSIS — I10 Essential (primary) hypertension: Secondary | ICD-10-CM | POA: Diagnosis not present

## 2014-02-07 DIAGNOSIS — E785 Hyperlipidemia, unspecified: Secondary | ICD-10-CM | POA: Insufficient documentation

## 2014-02-07 DIAGNOSIS — J449 Chronic obstructive pulmonary disease, unspecified: Secondary | ICD-10-CM | POA: Insufficient documentation

## 2014-02-07 DIAGNOSIS — I5042 Chronic combined systolic (congestive) and diastolic (congestive) heart failure: Secondary | ICD-10-CM

## 2014-02-07 DIAGNOSIS — I509 Heart failure, unspecified: Secondary | ICD-10-CM | POA: Diagnosis not present

## 2014-02-07 DIAGNOSIS — I493 Ventricular premature depolarization: Secondary | ICD-10-CM | POA: Diagnosis not present

## 2014-02-07 DIAGNOSIS — R931 Abnormal findings on diagnostic imaging of heart and coronary circulation: Secondary | ICD-10-CM

## 2014-02-07 DIAGNOSIS — F17200 Nicotine dependence, unspecified, uncomplicated: Secondary | ICD-10-CM | POA: Insufficient documentation

## 2014-02-07 MED ORDER — TECHNETIUM TC 99M SESTAMIBI GENERIC - CARDIOLITE
30.0000 | Freq: Once | INTRAVENOUS | Status: AC | PRN
  Administered 2014-02-07: 30 via INTRAVENOUS

## 2014-02-07 MED ORDER — TECHNETIUM TC 99M SESTAMIBI GENERIC - CARDIOLITE
10.0000 | Freq: Once | INTRAVENOUS | Status: AC | PRN
  Administered 2014-02-07: 10 via INTRAVENOUS

## 2014-02-07 NOTE — Progress Notes (Signed)
[facility] SITE 3 NUCLEAR MED [address] [place], [place] [address] [phone]    Cardiology Nuclear Med Study  Wesley Castro is a 64 y.o. male     MRN : [id number]     DOB: 25-Feb-1950  Procedure Date: 02/07/2014  Nuclear Med Background Indication for Stress Test:  Evaluation for Ischemia History:  COPD and PVT, CHF, Decreased EF Cardiac Risk Factors: Hypertension, Lipids and Smoker  Symptoms:  DOE and Palpitations   Nuclear Pre-Procedure Caffeine/Decaff Intake:  None> 12 hrs NPO After: 6:00pm   Lungs:  clear O2 Sat: 96% on room air. IV 0.9% NS with Angio Cath:  22g  IV Site: R Forearm x 1, tolerated well IV Started by:  Irean Hong, RN  Chest Size (in):  54 Cup Size: n/a  Height: 5\' 9"  (1.753 m)  Weight:  269 lb (122.018 kg)  BMI:  Body mass index is 39.71 kg/(m^2). Tech Comments:  Patient took Lisinopril and Coreg at 6:00pm yesterday.Irean Hong, RN.    Nuclear Med Study 1 or 2 day study: 1 day  Stress Test Type:  Stress  Reading MD: N/A  Order Authorizing Provider:  Tobias Alexander, MD  Resting Radionuclide: Technetium 22m Sestamibi  Resting Radionuclide Dose: 11.0 mCi   Stress Radionuclide:  Technetium 36m Sestamibi  Stress Radionuclide Dose: 33.0 mCi           Stress Protocol Rest HR: 65 Stress HR: 157  Rest BP: 103/62 Stress BP: 162/73  Exercise Time (min): 4:15 METS: 6.10   Predicted Max HR: 157 bpm % Max HR: 100 bpm Rate Pressure Product: 92924   Dose of Adenosine (mg):  n/a Dose of Lexiscan: n/a mg  Dose of Atropine (mg): n/a Dose of Dobutamine: n/a mcg/kg/min (at max HR)  Stress Test Technologist: Milana Na, EMT-P  Nuclear Technologist:  Kerby Nora, CNMT     Rest Procedure:  Myocardial perfusion imaging was performed at rest 45 minutes following the intravenous administration of Technetium 46m Sestamibi. Rest ECG: NSR with non-specific ST-T wave changes  Stress Procedure:  The patient exercised on the treadmill utilizing  the Bruce Protocol for 4:15 minutes. The patient stopped due to extreme sob and denied any chest pain.  Technetium 35m Sestamibi was injected at peak exercise and myocardial perfusion imaging was performed after a brief delay. Stress ECG: No significant change from baseline ECG.  Frequent PVCs  Couplets and 3 beat runs of VT during and after exercise.  QPS Raw Data Images:  Normal; no motion artifact; normal heart/lung ratio. Stress Images:  There is decreased uptake in the apex. Rest Images:  There is decreased uptake in the apex. Subtraction (SDS):  There is a fixed defect that is most consistent with a previous infarction. Transient Ischemic Dilatation (Normal <1.22):  1.00 Lung/Heart Ratio (Normal <0.45):  0.45  Quantitative Gated Spect Images QGS EDV:  236 ml QGS ESV:  171 ml  Impression Exercise Capacity:  Poor exercise capacity. BP Response:  Normal blood pressure response. Clinical Symptoms:  Extreme dyspnea. ECG Impression:  No significant ST segment change suggestive of ischemia. Comparison with Prior Nuclear Study: No previous nuclear study performed  Overall Impression:  High risk stress nuclear study. Perfusion imaging shows small apical scar with partial reversibility. SDS 5. Patient has marked LV cavity dilatation with global hypokinesis and EF 27%. Patient has very poor exercise tolerance. Frequent PVCs and short runs of NSVT and short runs of atrial tachycardia post exercise.  LV Ejection Fraction: 27%.  LV Wall Motion:  Global hypokinesis.  Cassell Clement MD

## 2014-02-09 ENCOUNTER — Telehealth: Payer: Self-pay | Admitting: *Deleted

## 2014-02-09 DIAGNOSIS — R0602 Shortness of breath: Secondary | ICD-10-CM

## 2014-02-09 DIAGNOSIS — I5042 Chronic combined systolic (congestive) and diastolic (congestive) heart failure: Secondary | ICD-10-CM

## 2014-02-09 DIAGNOSIS — Z01812 Encounter for preprocedural laboratory examination: Secondary | ICD-10-CM

## 2014-02-09 DIAGNOSIS — I10 Essential (primary) hypertension: Secondary | ICD-10-CM

## 2014-02-09 NOTE — Telephone Encounter (Signed)
-----   Message from Lars Masson, MD sent at 02/09/2014  9:40 AM EST ----- This patient needs to be scheduled for a cath, his LVEF has worsened and he has ischemia on stress test, plus runs of ns VT. He has recent labs, that are ok, he will need PT prior to cath.

## 2014-02-09 NOTE — Telephone Encounter (Signed)
Pt will need all pre-procedure labs set up prior to scheduled cath.  He will need cbc w diff, cmet, PT/INR.

## 2014-02-09 NOTE — Telephone Encounter (Signed)
Notified the pt that per Dr Delton See he needs to be set up for a cath, for his LVEF has worsened and he has ischemia on stress test, and runs of ns VT.  Informed the pt that he will need pre-procedure labs to be done prior to his cath.  Provided pt education to the pt on what a cath is and what to expect.  Pt reports that he will need to call back tomorrow, and look at his calendar and speak with his wife about this.  Pt states he will call the office back tomorrow to report the cath and lab date. Informed the pt to ask for me, however if I am unavailable them he should ask for a triage nurse to set this up.  Pt verbalized understanding and agrees with this plan.

## 2014-02-10 ENCOUNTER — Encounter: Payer: Self-pay | Admitting: *Deleted

## 2014-02-10 ENCOUNTER — Telehealth: Payer: Self-pay | Admitting: Cardiology

## 2014-02-10 NOTE — Telephone Encounter (Signed)
  Pt called back today to schedule his cath for next Wednesday 02/16/14 first case.  Scheduled the pt for a left heart cardiac cath for 02/16/14 at 0830 with Dr Katrinka Blazing.  Pt to come in for pre-procedure labs --cmet, cbc w diff, and pt/inr on 02-14-14 at [facility]  Pt is aware of lab appt on 02-14-14 and scheduled cath for 02/16/14 at 0830, and must be at [facility] at 0630 at short stay.  Informed the pt that when he comes in for his lab appt on 02-14-14, there will be a letter of instruction for him to pick-up in regards to his cath.  Provided pt education over the phone as well, in regards to cath instructions.  Pt verbalized understanding and agrees with this plan.  Will send Dr Delton See a message to place hospital orders in epic.

## 2014-02-10 NOTE — Telephone Encounter (Signed)
New message    Patient is calling to set up his heart cath and would call back to know when that is.

## 2014-02-12 NOTE — Addendum Note (Signed)
Addended by: Lars Masson on: 02/12/2014 04:20 PM   Modules accepted: Orders

## 2014-02-14 ENCOUNTER — Other Ambulatory Visit (INDEPENDENT_AMBULATORY_CARE_PROVIDER_SITE_OTHER): Payer: 59 | Admitting: *Deleted

## 2014-02-14 DIAGNOSIS — Z01812 Encounter for preprocedural laboratory examination: Secondary | ICD-10-CM

## 2014-02-14 DIAGNOSIS — R0602 Shortness of breath: Secondary | ICD-10-CM

## 2014-02-14 DIAGNOSIS — I5042 Chronic combined systolic (congestive) and diastolic (congestive) heart failure: Secondary | ICD-10-CM

## 2014-02-14 DIAGNOSIS — I10 Essential (primary) hypertension: Secondary | ICD-10-CM

## 2014-02-14 LAB — CBC WITH DIFFERENTIAL/PLATELET
Basophils Absolute: 0.1 10*3/uL (ref 0.0–0.1)
Basophils Relative: 0.6 % (ref 0.0–3.0)
Eosinophils Absolute: 0.3 10*3/uL (ref 0.0–0.7)
Eosinophils Relative: 2.8 % (ref 0.0–5.0)
HCT: 48.6 % (ref 39.0–52.0)
Hemoglobin: 16.4 g/dL (ref 13.0–17.0)
Lymphocytes Relative: 24.3 % (ref 12.0–46.0)
Lymphs Abs: 2.4 10*3/uL (ref 0.7–4.0)
MCHC: 33.8 g/dL (ref 30.0–36.0)
MCV: 88.3 fl (ref 78.0–100.0)
Monocytes Absolute: 0.8 10*3/uL (ref 0.1–1.0)
Monocytes Relative: 8.5 % (ref 3.0–12.0)
Neutro Abs: 6.3 10*3/uL (ref 1.4–7.7)
Neutrophils Relative %: 63.8 % (ref 43.0–77.0)
Platelets: 159 10*3/uL (ref 150.0–400.0)
RBC: 5.5 Mil/uL (ref 4.22–5.81)
RDW: 14 % (ref 11.5–15.5)
WBC: 9.9 10*3/uL (ref 4.0–10.5)

## 2014-02-14 LAB — COMPREHENSIVE METABOLIC PANEL
ALT: 22 U/L (ref 0–53)
AST: 29 U/L (ref 0–37)
Albumin: 3.9 g/dL (ref 3.5–5.2)
Alkaline Phosphatase: 55 U/L (ref 39–117)
BUN: 15 mg/dL (ref 6–23)
CO2: 31 mEq/L (ref 19–32)
Calcium: 9.6 mg/dL (ref 8.4–10.5)
Chloride: 102 mEq/L (ref 96–112)
Creatinine, Ser: 1.04 mg/dL (ref 0.40–1.50)
GFR: 76.56 mL/min (ref >60.00)
Glucose, Bld: 145 mg/dL — ABNORMAL HIGH (ref 70–99)
Potassium: 4.6 mEq/L (ref 3.5–5.1)
Sodium: 139 mEq/L (ref 135–145)
Total Bilirubin: 0.8 mg/dL (ref 0.2–1.2)
Total Protein: 7.4 g/dL (ref 6.0–8.3)

## 2014-02-14 LAB — PROTIME-INR
INR: 1.1 ratio — ABNORMAL HIGH (ref 0.8–1.0)
Prothrombin Time: 11.7 s (ref 9.6–13.1)

## 2014-02-15 ENCOUNTER — Ambulatory Visit: Payer: 59 | Admitting: Cardiology

## 2014-02-16 ENCOUNTER — Encounter (HOSPITAL_COMMUNITY): Payer: Self-pay | Admitting: Interventional Cardiology

## 2014-02-16 ENCOUNTER — Encounter (HOSPITAL_COMMUNITY)
Admission: RE | Disposition: A | Discharge status: Home / Self Care | Payer: Self-pay | Source: Ambulatory Visit | Attending: Interventional Cardiology

## 2014-02-16 ENCOUNTER — Ambulatory Visit (HOSPITAL_COMMUNITY)
Admission: RE | Admit: 2014-02-16 | Discharge: 2014-02-16 | Disposition: A | Discharge status: Home / Self Care | Payer: 59 | Source: Ambulatory Visit | Attending: Interventional Cardiology | Admitting: Interventional Cardiology

## 2014-02-16 DIAGNOSIS — E669 Obesity, unspecified: Secondary | ICD-10-CM | POA: Insufficient documentation

## 2014-02-16 DIAGNOSIS — I10 Essential (primary) hypertension: Secondary | ICD-10-CM | POA: Diagnosis not present

## 2014-02-16 DIAGNOSIS — G4733 Obstructive sleep apnea (adult) (pediatric): Secondary | ICD-10-CM | POA: Insufficient documentation

## 2014-02-16 DIAGNOSIS — I5042 Chronic combined systolic (congestive) and diastolic (congestive) heart failure: Secondary | ICD-10-CM

## 2014-02-16 DIAGNOSIS — R9439 Abnormal result of other cardiovascular function study: Secondary | ICD-10-CM | POA: Insufficient documentation

## 2014-02-16 DIAGNOSIS — Z7982 Long term (current) use of aspirin: Secondary | ICD-10-CM | POA: Insufficient documentation

## 2014-02-16 DIAGNOSIS — R931 Abnormal findings on diagnostic imaging of heart and coronary circulation: Secondary | ICD-10-CM

## 2014-02-16 DIAGNOSIS — F17210 Nicotine dependence, cigarettes, uncomplicated: Secondary | ICD-10-CM | POA: Insufficient documentation

## 2014-02-16 DIAGNOSIS — I5043 Acute on chronic combined systolic (congestive) and diastolic (congestive) heart failure: Secondary | ICD-10-CM | POA: Insufficient documentation

## 2014-02-16 DIAGNOSIS — E785 Hyperlipidemia, unspecified: Secondary | ICD-10-CM | POA: Diagnosis not present

## 2014-02-16 DIAGNOSIS — I429 Cardiomyopathy, unspecified: Secondary | ICD-10-CM | POA: Diagnosis not present

## 2014-02-16 HISTORY — PX: LEFT HEART CATHETERIZATION WITH CORONARY ANGIOGRAM: SHX5451

## 2014-02-16 SURGERY — LEFT HEART CATHETERIZATION WITH CORONARY ANGIOGRAM
Anesthesia: LOCAL

## 2014-02-16 MED ORDER — SODIUM CHLORIDE 0.9 % IJ SOLN: 3.0000 mL | INTRAMUSCULAR | Status: DC | PRN

## 2014-02-16 MED ORDER — VERAPAMIL HCL 2.5 MG/ML IV SOLN
INTRAVENOUS | Status: AC
  Filled 2014-02-16: qty 2

## 2014-02-16 MED ORDER — NITROGLYCERIN 1 MG/10 ML FOR IR/CATH LAB
INTRA_ARTERIAL | Status: AC
  Filled 2014-02-16: qty 10

## 2014-02-16 MED ORDER — HEPARIN (PORCINE) IN NACL 2-0.9 UNIT/ML-% IJ SOLN
INTRAMUSCULAR | Status: AC
Start: 2014-02-16 — End: 2014-02-16
  Filled 2014-02-16: qty 1000

## 2014-02-16 MED ORDER — ASPIRIN 81 MG PO CHEW
CHEWABLE_TABLET | ORAL | Status: AC
  Filled 2014-02-16: qty 1

## 2014-02-16 MED ORDER — ASPIRIN 81 MG PO CHEW
81.0000 mg | CHEWABLE_TABLET | ORAL | Status: AC
  Administered 2014-02-16: 81 mg via ORAL

## 2014-02-16 MED ORDER — LIDOCAINE HCL (PF) 1 % IJ SOLN
INTRAMUSCULAR | Status: AC
  Filled 2014-02-16: qty 30

## 2014-02-16 MED ORDER — MIDAZOLAM HCL 2 MG/2ML IJ SOLN
INTRAMUSCULAR | Status: AC
  Filled 2014-02-16: qty 2

## 2014-02-16 MED ORDER — SODIUM CHLORIDE 0.9 % IV SOLN
INTRAVENOUS | Status: DC
  Administered 2014-02-16: 07:00:00 via INTRAVENOUS

## 2014-02-16 MED ORDER — SODIUM CHLORIDE 0.9 % IV SOLN: 250.0000 mL | INTRAVENOUS | Status: DC | PRN

## 2014-02-16 MED ORDER — SODIUM CHLORIDE 0.9 % IJ SOLN: 3.0000 mL | Freq: Two times a day (BID) | INTRAMUSCULAR | Status: DC

## 2014-02-16 MED ORDER — SODIUM CHLORIDE 0.9 % IV SOLN: 1.0000 mL/kg/h | INTRAVENOUS | Status: AC

## 2014-02-16 MED ORDER — FENTANYL CITRATE 0.05 MG/ML IJ SOLN
INTRAMUSCULAR | Status: AC
  Filled 2014-02-16: qty 2

## 2014-02-16 MED ORDER — HEPARIN SODIUM (PORCINE) 1000 UNIT/ML IJ SOLN
INTRAMUSCULAR | Status: AC
  Filled 2014-02-16: qty 1

## 2014-02-16 NOTE — Interval H&P Note (Signed)
Cath Lab Visit (complete for each Cath Lab visit)  Clinical Evaluation Leading to the Procedure:   ACS: No.  Non-ACS:    Anginal Classification: CCS II  Anti-ischemic medical therapy: Minimal Therapy (1 class of medications)  Non-Invasive Test Results: High-risk stress test findings: cardiac mortality >3%/year  Prior CABG: No previous CABG  Ischemic Symptoms? CCS II (Slight limitation of ordinary activity) Anti-ischemic Medical Therapy? Minimal Therapy (1 class of medications) Non-invasive Test Results? High-risk stress test findings: cardiac mortality >3%/yr Prior CABG? No Previous CABG   Patient Information:   1-2V CAD, no prox LAD  A (7)  Indication: 18; Score: 7   Patient Information:   CTO of 1 vessel, no other CAD  U (5)  Indication: 28; Score: 5   Patient Information:   1V CAD with prox LAD  A (8)  Indication: 34; Score: 8   Patient Information:   2V-CAD with prox LAD  A (8)  Indication: 40; Score: 8   Patient Information:   3V-CAD without LMCA  A (8)  Indication: 46; Score: 8   Patient Information:   3V-CAD without LMCA With Abnormal LV systolic function  A (9)  Indication: 48; Score: 9   Patient Information:   LMCA-CAD  A (9)  Indication: 49; Score: 9   Patient Information:   2V-CAD with prox LAD PCI  A (7)  Indication: 62; Score: 7   Patient Information:   2V-CAD with prox LAD CABG  A (8)  Indication: 62; Score: 8   Patient Information:   3V-CAD without LMCA With Low CAD burden(i.e., 3 focal stenoses, low SYNTAX score) PCI  A (7)  Indication: 63; Score: 7   Patient Information:   3V-CAD without LMCA With Low CAD burden(i.e., 3 focal stenoses, low SYNTAX score) CABG  A (9)  Indication: 63; Score: 9   Patient Information:   3V-CAD without LMCA E06c - Intermediate-high CAD burden (i.e., multiple diffuse lesions, presence of CTO, or high SYNTAX score) PCI  U (4)  Indication: 64; Score: 4   Patient  Information:   3V-CAD without LMCA E06c - Intermediate-high CAD burden (i.e., multiple diffuse lesions, presence of CTO, or high SYNTAX score) CABG  A (9)  Indication: 64; Score: 9   Patient Information:   LMCA-CAD With Isolated LMCA stenosis  PCI  U (6)  Indication: 65; Score: 6   Patient Information:   LMCA-CAD With Isolated LMCA stenosis  CABG  A (9)  Indication: 65; Score: 9   Patient Information:   LMCA-CAD Additional CAD, low CAD burden (i.e., 1- to 2-vessel additional involvement, low SYNTAX score) PCI  U (5)  Indication: 66; Score: 5   Patient Information:   LMCA-CAD Additional CAD, low CAD burden (i.e., 1- to 2-vessel additional involvement, low SYNTAX score) CABG  A (9)  Indication: 66; Score: 9   Patient Information:   LMCA-CAD Additional CAD, intermediate-high CAD burden (i.e., 3-vessel involvement, presence of CTO, or high SYNTAX score) PCI  I (3)  Indication: 67; Score: 3   Patient Information:   LMCA-CAD Additional CAD, intermediate-high CAD burden (i.e., 3-vessel involvement, presence of CTO, or high SYNTAX score) CABG  A (9)  Indication: 67; Score: 9     History and Physical Interval Note:  02/16/2014 8:50 AM  Wesley Castro  has presented today for surgery, with the diagnosis of abnormal stress test  The various methods of treatment have been discussed with the patient and family. After consideration of risks, benefits and other options for  treatment, the patient has consented to  Procedure(s): LEFT HEART CATHETERIZATION WITH CORONARY ANGIOGRAM (N/A) as a surgical intervention .  The patient's history has been reviewed, patient examined, no change in status, stable for surgery.  I have reviewed the patient's chart and labs.  Questions were answered to the patient's satisfaction.     VARANASI,JAYADEEP S.

## 2014-02-16 NOTE — H&P (Signed)
Wesley Castro is an 64 y.o. male.   Primary Cardiologist: Dr. Meda Castro PMD:  HPI: 64 y/o with h/o heart failure.  He had a hgh risk nuclear stress test with EF 27%.  He is referred for cath.  Past Medical History  Diagnosis Date  . Acute on chronic combined systolic and diastolic heart failure   . Other and unspecified hyperlipidemia   . Other abnormal glucose   . Unspecified essential hypertension   . Leukocytosis, unspecified   . Obesity, unspecified   . Obstructive sleep apnea (adult) (pediatric)   . Paroxysmal ventricular tachycardia   . Arrhythmia     No past surgical history on file.  Family History  Problem Relation Age of Onset  . Lung cancer Maternal Grandfather   . Ovarian cancer Mother 34   Social History:  reports that he has been smoking.  He does not have any smokeless tobacco history on file. He reports that he drinks alcohol. He reports that he does not use illicit drugs.  Allergies: No Known Allergies  Medications Prior to Admission  Medication Sig Dispense Refill  . aspirin EC 81 MG tablet Take 81 mg by mouth daily.    . carvedilol (COREG) 3.125 MG tablet Take 1 tablet (3.125 mg total) by mouth 2 (two) times daily with a meal. (Patient taking differently: Take 6.25 mg by mouth at bedtime. ) 180 tablet 3  . furosemide (LASIX) 40 MG tablet TAKE 1 TABLET BY MOUTH EVERY DAY (Patient taking differently: Take 40 mg by mouth at bedtime. ) 90 tablet 3  . ibuprofen (ADVIL,MOTRIN) 200 MG tablet Take 200-400 mg by mouth every 6 (six) hours as needed (pain).    Marland Kitchen lisinopril (PRINIVIL,ZESTRIL) 10 MG tablet Take 1 tablet (10 mg total) by mouth daily. (Patient taking differently: Take 10 mg by mouth at bedtime. ) 90 tablet 3  . pravastatin (PRAVACHOL) 40 MG tablet Take 1 tablet (40 mg total) by mouth daily. (Patient taking differently: Take 40 mg by mouth at bedtime. ) 90 tablet 3    Results for orders placed or performed in visit on 02/14/14 (from the past 48 hour(s))  CBC  w/Diff     Status: None   Collection Time: 02/14/14 10:01 AM  Result Value Ref Range   WBC 9.9 4.0 - 10.5 K/uL   RBC 5.50 4.22 - 5.81 Mil/uL   Hemoglobin 16.4 13.0 - 17.0 g/dL   HCT 48.6 39.0 - 52.0 %   MCV 88.3 78.0 - 100.0 fl   MCHC 33.8 30.0 - 36.0 g/dL   RDW 14.0 11.5 - 15.5 %   Platelets 159.0 150.0 - 400.0 K/uL   Neutrophils Relative % 63.8 43.0 - 77.0 %   Lymphocytes Relative 24.3 12.0 - 46.0 %   Monocytes Relative 8.5 3.0 - 12.0 %   Eosinophils Relative 2.8 0.0 - 5.0 %   Basophils Relative 0.6 0.0 - 3.0 %   Neutro Abs 6.3 1.4 - 7.7 K/uL   Lymphs Abs 2.4 0.7 - 4.0 K/uL   Monocytes Absolute 0.8 0.1 - 1.0 K/uL   Eosinophils Absolute 0.3 0.0 - 0.7 K/uL   Basophils Absolute 0.1 0.0 - 0.1 K/uL  INR/PT     Status: Abnormal   Collection Time: 02/14/14 10:01 AM  Result Value Ref Range   INR 1.1 (H) 0.8 - 1.0 ratio   Prothrombin Time 11.7 9.6 - 13.1 sec  Comp Met (CMET)     Status: Abnormal   Collection Time: 02/14/14 10:01 AM  Result Value Ref Range   Sodium 139 135 - 145 mEq/L   Potassium 4.6 3.5 - 5.1 mEq/L   Chloride 102 96 - 112 mEq/L   CO2 31 19 - 32 mEq/L   Glucose, Bld 145 (H) 70 - 99 mg/dL   BUN 15 6 - 23 mg/dL   Creatinine, Ser 1.04 0.40 - 1.50 mg/dL   Total Bilirubin 0.8 0.2 - 1.2 mg/dL   Alkaline Phosphatase 55 39 - 117 U/L   AST 29 0 - 37 U/L   ALT 22 0 - 53 U/L   Total Protein 7.4 6.0 - 8.3 g/dL   Albumin 3.9 3.5 - 5.2 g/dL   Calcium 9.6 8.4 - 10.5 mg/dL   GFR 76.56 >60.00 mL/min   No results found.  ROS: DOE, feels his PVCs, joint pain, all others negative  OBJECTIVE:   Vitals:   Filed Vitals:   02/16/14 0651  BP: 116/46  Pulse: 46  Temp: 97.6 F (36.4 C)  TempSrc: Oral  Resp: 18  Height: _0  (1.778 m)  Weight: 270 lb (122.471 kg)  SpO2: 96%   I&O's:  No intake or output data in the 24 hours ending 02/16/14 0738 :     PHYSICAL EXAM General: Well developed, well nourished, in no acute distress Head:   Normal cephalic and  atramatic  Lungs:   Clear bilaterally to auscultation. Heart:   HRRR S1 S2  No JVD.   Abdomen: abdomen soft and non-tender Msk:  Back normal,  Normal strength and tone for age. Extremities:   No edema.   Neuro: Alert and oriented. Psych:  Normal affect, responds appropriately  LABS: Basic Metabolic Panel:  Recent Labs  02/14/14 1001  NA 139  K 4.6  CL 102  CO2 31  GLUCOSE 145*  BUN 15  CREATININE 1.04  CALCIUM 9.6   Liver Function Tests:  Recent Labs  02/14/14 1001  AST 29  ALT 22  ALKPHOS 55  BILITOT 0.8  PROT 7.4  ALBUMIN 3.9   No results for input(s): LIPASE, AMYLASE in the last 72 hours. CBC:  Recent Labs  02/14/14 1001  WBC 9.9  NEUTROABS 6.3  HGB 16.4  HCT 48.6  MCV 88.3  PLT 159.0   Cardiac Enzymes: No results for input(s): CKTOTAL, CKMB, CKMBINDEX, TROPONINI in the last 72 hours. BNP: Invalid input(s): POCBNP D-Dimer: No results for input(s): DDIMER in the last 72 hours. Hemoglobin A1C: No results for input(s): HGBA1C in the last 72 hours. Fasting Lipid Panel: No results for input(s): CHOL, HDL, LDLCALC, TRIG, CHOLHDL, LDLDIRECT in the last 72 hours. Thyroid Function Tests: No results for input(s): TSH, T4TOTAL, T3FREE, THYROIDAB in the last 72 hours.  Invalid input(s): FREET3 Anemia Panel: No results for input(s): VITAMINB12, FOLATE, FERRITIN, TIBC, IRON, RETICCTPCT in the last 72 hours. Coag Panel:   Lab Results  Component Value Date   INR 1.1* 02/14/2014       Assessment/Plan Plan Cardiac cath to eval for ischemia as cause of heart failure.  Procedure explained earlier by office staff.  All questions answered prior to procedure.  Wesley Castro S. 02/16/2014, 7:38 AM

## 2014-02-16 NOTE — CV Procedure (Signed)
       PROCEDURE:  Left heart catheterization with selective coronary angiography, left ventriculogram.  INDICATIONS:  Abnormal stress test, cardiomyopathy  The risks, benefits, and details of the procedure were explained to the patient.  The patient verbalized understanding and wanted to proceed.  Informed written consent was obtained.  PROCEDURE TECHNIQUE:  After Xylocaine anesthesia a 21F slender sheath was placed in the right radial artery with a single anterior needle wall stick.   IV Heparin was given.  Right coronary angiography was done using a Judkins R4 guide catheter.  Left coronary angiography was done using a Judkins L3.5 guide catheter.  This catheter would not stay in place.  An EBU 3.0 catheter was then used successfully.  Left ventriculography was done using a pigtail catheter.  A TR band was used for hemostasis.   CONTRAST:  Total of 120 cc.  COMPLICATIONS:  None.    HEMODYNAMICS:  Aortic pressure was 112/65; LV pressure was 111/6; LVEDP 20.  There was no gradient between the left ventricle and aorta.    ANGIOGRAPHIC DATA:   The left main coronary artery is widely patent.  The left anterior descending artery is a large vessel which wraps around the apex. There are several large diagonals which are widely patent. There is no significant disease in the LAD system.  The left circumflex artery is a large vessel, dominant vessel. The first obtuse marginal is large with mild atherosclerosis.  The second obtuse marginal is widely patent. The left PDA is widely patent.  The right coronary artery is a nondominant vessel which is widely patent.  LEFT VENTRICULOGRAM:  Left ventricular angiogram was done in the 30 RAO projection and revealed normal left ventricular wall motion and systolic function with an estimated ejection fraction of 25%.  LVEDP was 20 mmHg.  IMPRESSIONS:  1. Normal left main coronary artery. 2. Widely patent left anterior descending artery and its  branches. 3. Widely patent dominant left circumflex artery and its branches. 4. Patent nondominant right coronary artery. 5. Severe left ventricular systolic dysfunction.  LVEDP 20 mmHg.  Ejection fraction 25%.  RECOMMENDATION:  Nonischemic cardiomyopathy. Continue with aggressive medical therapy. The patient will follow-up with Dr. Delton See.

## 2014-02-16 NOTE — Discharge Instructions (Signed)
Radial Site Care °Refer to this sheet in the next few weeks. These instructions provide you with information on caring for yourself after your procedure. Your caregiver may also give you more specific instructions. Your treatment has been planned according to current medical practices, but problems sometimes occur. Call your caregiver if you have any problems or questions after your procedure. °HOME CARE INSTRUCTIONS °· You may shower the day after the procedure. Remove the bandage (dressing) and gently wash the site with plain soap and water. Gently pat the site dry. °· Do not apply powder or lotion to the site. °· Do not submerge the affected site in water for 3 to 5 days. °· Inspect the site at least twice daily. °· Do not flex or bend the affected arm for 24 hours. °· No lifting over 5 pounds (2.3 kg) for 5 days after your procedure. °· Do not drive home if you are discharged the same day of the procedure. Have someone else drive you. °· You may drive 24 hours after the procedure unless otherwise instructed by your caregiver. °· Do not operate machinery or power tools for 24 hours. °· A responsible adult should be with you for the first 24 hours after you arrive home. °What to expect: °· Any bruising will usually fade within 1 to 2 weeks. °· Blood that collects in the tissue (hematoma) may be painful to the touch. It should usually decrease in size and tenderness within 1 to 2 weeks. °SEEK IMMEDIATE MEDICAL CARE IF: °· You have unusual pain at the radial site. °· You have redness, warmth, swelling, or pain at the radial site. °· You have drainage (other than a small amount of blood on the dressing). °· You have chills. °· You have a fever or persistent symptoms for more than 72 hours. °· You have a fever and your symptoms suddenly get worse. °· Your arm becomes pale, cool, tingly, or numb. °· You have heavy bleeding from the site. Hold pressure on the site and call 911. °Document Released: 02/09/2010 Document  Revised: 04/01/2011 Document Reviewed: 02/09/2010 °ExitCare® Patient Information ©2015 [employer]. This information is not intended to replace advice given to you by your health care provider. Make sure you discuss any questions you have with your health care provider. ° °

## 2014-03-25 ENCOUNTER — Other Ambulatory Visit: Payer: 59

## 2015-01-04 ENCOUNTER — Other Ambulatory Visit: Payer: Self-pay

## 2015-01-04 ENCOUNTER — Other Ambulatory Visit: Payer: Self-pay | Admitting: Cardiology

## 2015-01-04 DIAGNOSIS — I5041 Acute combined systolic (congestive) and diastolic (congestive) heart failure: Secondary | ICD-10-CM

## 2015-01-04 MED ORDER — LISINOPRIL 10 MG PO TABS: 10.0000 mg | ORAL_TABLET | Freq: Every day | ORAL | Status: DC

## 2015-02-20 NOTE — Progress Notes (Signed)
Cardiology Office Note:    Date:  02/21/2015   ID:  Rolin Barry, DOB Feb 08, 1950, MRN [id number]  PCP:  Kristian Covey, MD  Cardiologist:  Dr. Tobias Alexander   Electrophysiologist:  n/a  Chief Complaint  Patient presents with  . Congestive Heart Failure    Follow-up    History of Present Illness:    Wesley Castro is a 66 y.o. male with a hx of chronic combined systolic and diastolic CHF with EF 45-50%, HTN, HL. Previously followed by Dr. Daleen Squibb.   Last seen by Dr. Delton See in 12/15. Repeat echocardiogram demonstrated worsening LV function with an EF of 25-30%. Nuclear study was high risk and cardiac catheterization was arranged. This demonstrated no significant CAD. The patient has not been seen in follow-up since that time.  Since last seen, he has been doing well. He denies chest pain or significant dyspnea. He denies orthopnea, PND or significant edema. Denies syncope.   Past Medical History  Diagnosis Date  . Chronic combined systolic and diastolic CHF (congestive heart failure) (HCC)     Echo 1/16: Mild LVH, EF 25-30%, diffuse HK, restrictive physiology, MAC, trivial MR, mild BAE  . NICM (nonischemic cardiomyopathy) (HCC)     a. Myoview 1/16 - high risk, small apical scar with partial reversibility, EF 27%, NSVT, ATach >> b. LHC 1/16: no sig CAD, EF 25%  . HLD (hyperlipidemia)   . HTN (hypertension)   . Leukocytosis, unspecified   . Obesity, unspecified   . OSA (obstructive sleep apnea)   . NSVT (nonsustained ventricular tachycardia) (HCC)     noted during hospital admit in 2011 and during stress Nuc in 2016    Past Surgical History  Procedure Laterality Date  . Left heart catheterization with coronary angiogram N/A 02/16/2014    Procedure: LEFT HEART CATHETERIZATION WITH CORONARY ANGIOGRAM;  Surgeon: Corky Crafts, MD;  Location: [facility] CATH LAB;  Service: Cardiovascular;  Laterality: N/A;    Current Medications: Outpatient Prescriptions Prior to Visit    Medication Sig Dispense Refill  . aspirin EC 81 MG tablet Take 81 mg by mouth daily.    Marland Kitchen ibuprofen (ADVIL,MOTRIN) 200 MG tablet Take 200-400 mg by mouth every 6 (six) hours as needed (pain).    . carvedilol (COREG) 3.125 MG tablet TAKE 1 TABLET BY MOUTH TWICE A DAY WITH A MEAL 180 tablet 0  . furosemide (LASIX) 40 MG tablet TAKE 1 TABLET BY MOUTH EVERY DAY 90 tablet 0  . lisinopril (PRINIVIL,ZESTRIL) 10 MG tablet Take 1 tablet (10 mg total) by mouth daily. 30 tablet 0  . pravastatin (PRAVACHOL) 40 MG tablet TAKE 1 TABLET (40 MG TOTAL) BY MOUTH DAILY. 90 tablet 0   No facility-administered medications prior to visit.     Allergies:   Review of patient's allergies indicates no known allergies.   Social History   Social History  . Marital Status: Married    Spouse Name: N/A  . Number of Children: N/A  . Years of Education: N/A   Social History Main Topics  . Smoking status: Current Every Day Smoker    Last Attempt to Quit: 02/05/2009  . Smokeless tobacco: None     Comment: quit Jan 2011  . Alcohol Use: Yes  . Drug Use: No  . Sexual Activity: Not Asked   Other Topics Concern  . None   Social History Narrative     Family History:  The patient's family history includes Lung cancer in his maternal grandfather; Ovarian cancer (age  of onset: 23) in his mother.   ROS:   Please see the history of present illness.    Review of Systems  Musculoskeletal: Positive for joint pain.  All other systems reviewed and are negative.   Physical Exam:    VS:  BP 102/60 mmHg  Pulse 68  Ht  (1.778 m)  Wt 278 lb (126.1 kg)  BMI 39.89 kg/m2   GEN: Well nourished, well developed, in no acute distress HEENT: normal Neck: no JVD, no masses Cardiac: Normal S1/S2, RRR; no murmurs, no edema   Respiratory:  clear to auscultation bilaterally; no wheezing, rhonchi or rales GI: soft, nontender, nondistended MS: no deformity or atrophy Skin: warm and dry, no rash Neuro:  no focal  deficits  Psych: Alert and oriented x 3, normal affect  Wt Readings from Last 3 Encounters:  02/21/15 278 lb (126.1 kg)  02/16/14 270 lb (122.471 kg)  02/07/14 269 lb (122.018 kg)      Studies/Labs Reviewed:    EKG:  EKG is  ordered today.  The ekg ordered today demonstrates NSR, HR 68, LAD, anteroseptal Q waves, QTc 440 ms, no change from prior tracings.    Recent Labs: No results found for requested labs within last 365 days.   Recent Lipid Panel    Component Value Date/Time   CHOL 137 01/31/2014 0851   TRIG 149.0 01/31/2014 0851   HDL 25.60* 01/31/2014 0851   CHOLHDL 5 01/31/2014 0851   VLDL 29.8 01/31/2014 0851   LDLCALC 82 01/31/2014 0851    Additional studies/ records that were reviewed today include:   LHC 02/16/14 LM patent LAD patent LCx OM1 with mild atherosclerosis RCA patent EF 25%  Myoview 02/08/14 High risk stress nuclear study. Perfusion imaging shows small apical scar with partial reversibility. SDS 5. Patient has marked LV cavity dilatation with global hypokinesis and EF 27%. Patient has very poor exercise tolerance. Frequent PVCs and short runs of NSVT and short runs of atrial tachycardia post exercise.  LV Ejection Fraction: 27%.  Echo 01/31/14 Mild LVH, EF 25-30%, diffuse HK, restrictive physiology, MAC, trivial MR, mild BAE   ASSESSMENT:    1. Chronic combined systolic and diastolic CHF (congestive heart failure) (HCC)   2. NICM (nonischemic cardiomyopathy) (HCC)   3. Benign essential HTN   4. HLD (hyperlipidemia)   5. NSVT (nonsustained ventricular tachycardia) (HCC)   6. Tobacco use     PLAN:    In order of problems listed above:  1. Chronic combined systolic and diastolic CHF - He is NYHA 2. Volume is stable. Continue current dose of Lasix. Obtain FU BMET.  Continue current dose of ACE inhibitor and beta-blocker.  His BP will not allow further titration of medications. He has not had a recent admission for CHF.  I do not see an  indication for Entresto.  His HR is < 70.  No indication for Ivabridine.   2. NICM - EF 1 year ago was 25-30%.  He has had NSVT documented in the past.  I will arrange FU Echo.  If EF is < 35%, refer to EP for possible ICD.  I d/w the patient.   3. HTN - Controlled.   4. HL - Continue statin. Obtain FU Lipids and LFTs.   5. NSVT - As noted, he has had NSVT documented in the past.  Continue beta-blocker.  If EF < 35% on echo, refer to EP.    6. Tobacco abuse - He knows that he needs  to quit.     Medication Adjustments/Labs and Tests Ordered: Current medicines are reviewed at length with the patient today.  Concerns regarding medicines are outlined above.  Medication changes, Labs and Tests ordered today are outlined in the Patient Instructions noted below. Patient Instructions  Medication Instructions:  No changes.  See your medication list.  Labwork: Today - BMET, LFTs, Lipid Panel  Testing/Procedures: 1. Schedule an Echocardiogram.  Follow-Up: Dr. Tobias Alexander in 6 months.  Any Other Special Instructions Will Be Listed Below (If Applicable).  Your physician has requested that you have an echocardiogram. Echocardiography is a painless test that uses sound waves to create images of your heart. It provides your doctor with information about the size and shape of your heart and how well your heart's chambers and valves are working. This procedure takes approximately one hour. There are no restrictions for this procedure.   Signed, Tereso Newcomer, PA-C  02/21/2015 9:55 AM    [facility] [address] [place], [place], [place]  [address] Phone: [phone]; Fax: [phone]

## 2015-02-21 ENCOUNTER — Ambulatory Visit (INDEPENDENT_AMBULATORY_CARE_PROVIDER_SITE_OTHER): Payer: Commercial Managed Care - HMO | Admitting: Physician Assistant

## 2015-02-21 ENCOUNTER — Encounter: Payer: Self-pay | Admitting: Physician Assistant

## 2015-02-21 VITALS — BP 102/60 | HR 68 | Ht 70.0 in | Wt 278.0 lb

## 2015-02-21 DIAGNOSIS — E785 Hyperlipidemia, unspecified: Secondary | ICD-10-CM

## 2015-02-21 DIAGNOSIS — I5042 Chronic combined systolic (congestive) and diastolic (congestive) heart failure: Secondary | ICD-10-CM

## 2015-02-21 DIAGNOSIS — I472 Ventricular tachycardia: Secondary | ICD-10-CM

## 2015-02-21 DIAGNOSIS — I429 Cardiomyopathy, unspecified: Secondary | ICD-10-CM

## 2015-02-21 DIAGNOSIS — I10 Essential (primary) hypertension: Secondary | ICD-10-CM

## 2015-02-21 DIAGNOSIS — I428 Other cardiomyopathies: Secondary | ICD-10-CM

## 2015-02-21 DIAGNOSIS — I4729 Other ventricular tachycardia: Secondary | ICD-10-CM

## 2015-02-21 DIAGNOSIS — Z720 Tobacco use: Secondary | ICD-10-CM

## 2015-02-21 LAB — HEPATIC FUNCTION PANEL
ALBUMIN: 3.7 g/dL (ref 3.6–5.1)
ALK PHOS: 58 U/L (ref 40–115)
ALT: 19 U/L (ref 9–46)
AST: 25 U/L (ref 10–35)
BILIRUBIN TOTAL: 0.5 mg/dL (ref 0.2–1.2)
Bilirubin, Direct: 0.1 mg/dL (ref <=0.2)
Indirect Bilirubin: 0.4 mg/dL (ref 0.2–1.2)
TOTAL PROTEIN: 6.9 g/dL (ref 6.1–8.1)

## 2015-02-21 LAB — LIPID PANEL
CHOL/HDL RATIO: 4.1 ratio (ref <=5.0)
Cholesterol: 111 mg/dL — ABNORMAL LOW (ref 125–200)
HDL: 27 mg/dL — ABNORMAL LOW (ref >=40)
LDL Cholesterol: 62 mg/dL (ref <130)
TRIGLYCERIDES: 112 mg/dL (ref <150)
VLDL: 22 mg/dL (ref <30)

## 2015-02-21 LAB — BASIC METABOLIC PANEL
BUN: 13 mg/dL (ref 7–25)
CALCIUM: 9.4 mg/dL (ref 8.6–10.3)
CO2: 27 mmol/L (ref 20–31)
Chloride: 101 mmol/L (ref 98–110)
Creat: 1.1 mg/dL (ref 0.70–1.25)
GLUCOSE: 162 mg/dL — AB (ref 65–99)
Potassium: 4.7 mmol/L (ref 3.5–5.3)
SODIUM: 137 mmol/L (ref 135–146)

## 2015-02-21 MED ORDER — FUROSEMIDE 40 MG PO TABS: 40.0000 mg | ORAL_TABLET | Freq: Every day | ORAL | Status: AC

## 2015-02-21 MED ORDER — LISINOPRIL 10 MG PO TABS: 10.0000 mg | ORAL_TABLET | Freq: Every day | ORAL | Status: DC

## 2015-02-21 MED ORDER — PRAVASTATIN SODIUM 40 MG PO TABS: ORAL_TABLET | ORAL | Status: AC

## 2015-02-21 MED ORDER — LISINOPRIL 10 MG PO TABS: 10.0000 mg | ORAL_TABLET | Freq: Every day | ORAL | Status: AC

## 2015-02-21 MED ORDER — CARVEDILOL 3.125 MG PO TABS: ORAL_TABLET | ORAL | Status: AC

## 2015-02-21 NOTE — Patient Instructions (Signed)
Medication Instructions:  No changes.  See your medication list.  Labwork: Today - BMET, LFTs, Lipid Panel  Testing/Procedures: 1. Schedule an Echocardiogram.  Follow-Up: Dr. Tobias Alexander in 6 months.  Any Other Special Instructions Will Be Listed Below (If Applicable).  Your physician has requested that you have an echocardiogram. Echocardiography is a painless test that uses sound waves to create images of your heart. It provides your doctor with information about the size and shape of your heart and how well your heart's chambers and valves are working. This procedure takes approximately one hour. There are no restrictions for this procedure.

## 2015-02-23 ENCOUNTER — Ambulatory Visit: Payer: 59 | Admitting: Physician Assistant

## 2015-02-27 ENCOUNTER — Ambulatory Visit (HOSPITAL_COMMUNITY): Payer: Commercial Managed Care - HMO | Attending: Cardiovascular Disease

## 2015-02-27 ENCOUNTER — Other Ambulatory Visit: Payer: Self-pay

## 2015-02-27 DIAGNOSIS — I255 Ischemic cardiomyopathy: Secondary | ICD-10-CM | POA: Diagnosis present

## 2015-02-27 DIAGNOSIS — I10 Essential (primary) hypertension: Secondary | ICD-10-CM | POA: Diagnosis not present

## 2015-02-27 DIAGNOSIS — I340 Nonrheumatic mitral (valve) insufficiency: Secondary | ICD-10-CM | POA: Insufficient documentation

## 2015-02-27 DIAGNOSIS — I517 Cardiomegaly: Secondary | ICD-10-CM | POA: Diagnosis not present

## 2015-02-27 DIAGNOSIS — I428 Other cardiomyopathies: Secondary | ICD-10-CM

## 2015-02-27 DIAGNOSIS — I429 Cardiomyopathy, unspecified: Secondary | ICD-10-CM | POA: Diagnosis not present

## 2015-02-27 DIAGNOSIS — E785 Hyperlipidemia, unspecified: Secondary | ICD-10-CM | POA: Insufficient documentation

## 2015-02-27 DIAGNOSIS — Z6839 Body mass index (BMI) 39.0-39.9, adult: Secondary | ICD-10-CM | POA: Insufficient documentation

## 2015-02-28 ENCOUNTER — Encounter: Payer: Self-pay | Admitting: Physician Assistant

## 2015-03-01 ENCOUNTER — Telehealth: Payer: Self-pay | Admitting: *Deleted

## 2015-03-01 NOTE — Telephone Encounter (Signed)
Pt has been notified of echo results and findings by phone with verbal understanding. pt agreeable to EP referral to discuss ? ICD. Advised Glynda Jaeger EP scheduler will call to make an appt.

## 2015-03-03 ENCOUNTER — Encounter: Payer: Self-pay | Admitting: Physician Assistant

## 2015-03-23 IMAGING — CR DG CHEST 2V
2 series · 2 of 2 positions shown · non-contrast
Comparison: Chest CT 02/14/2009.  Chest radiography 02/14/2009.

CLINICAL DATA: Smoking history.  Cough and shortness of breath.

EXAM:
CHEST  2 VIEW

[view not recorded (1 of 2)]
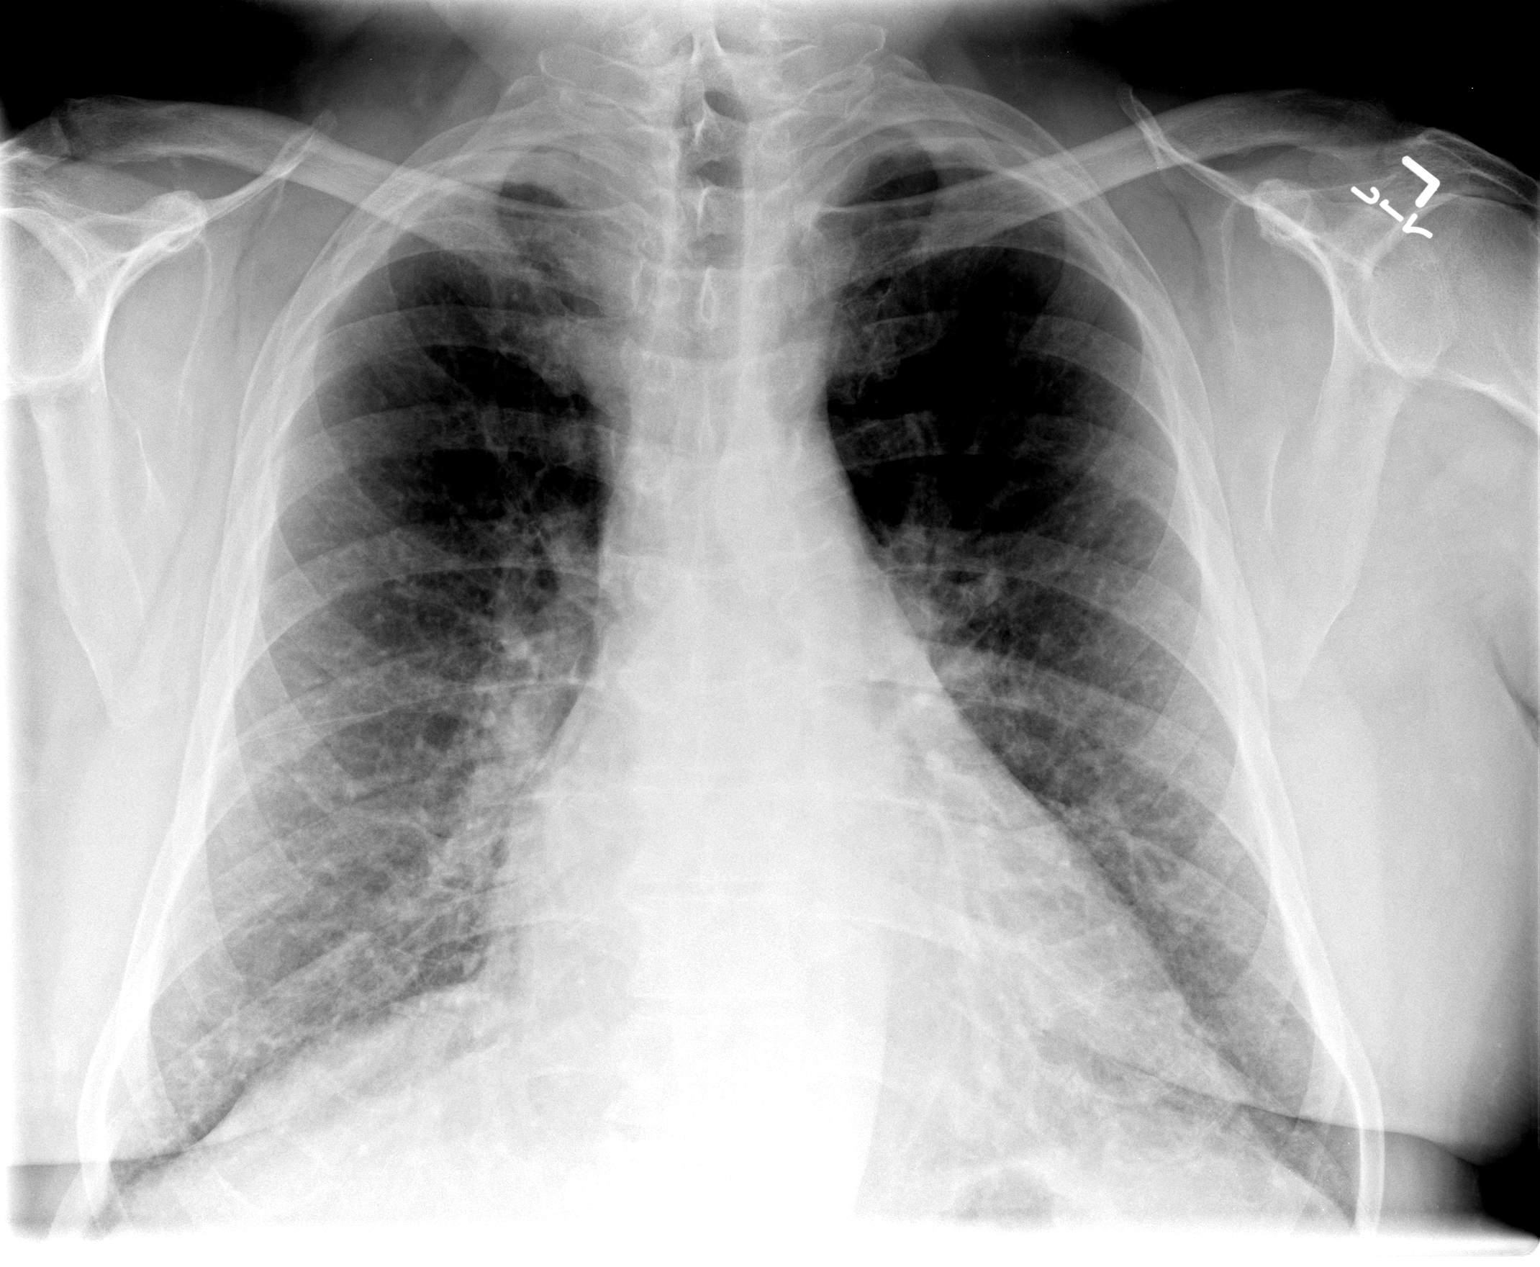

[view not recorded (2 of 2)]
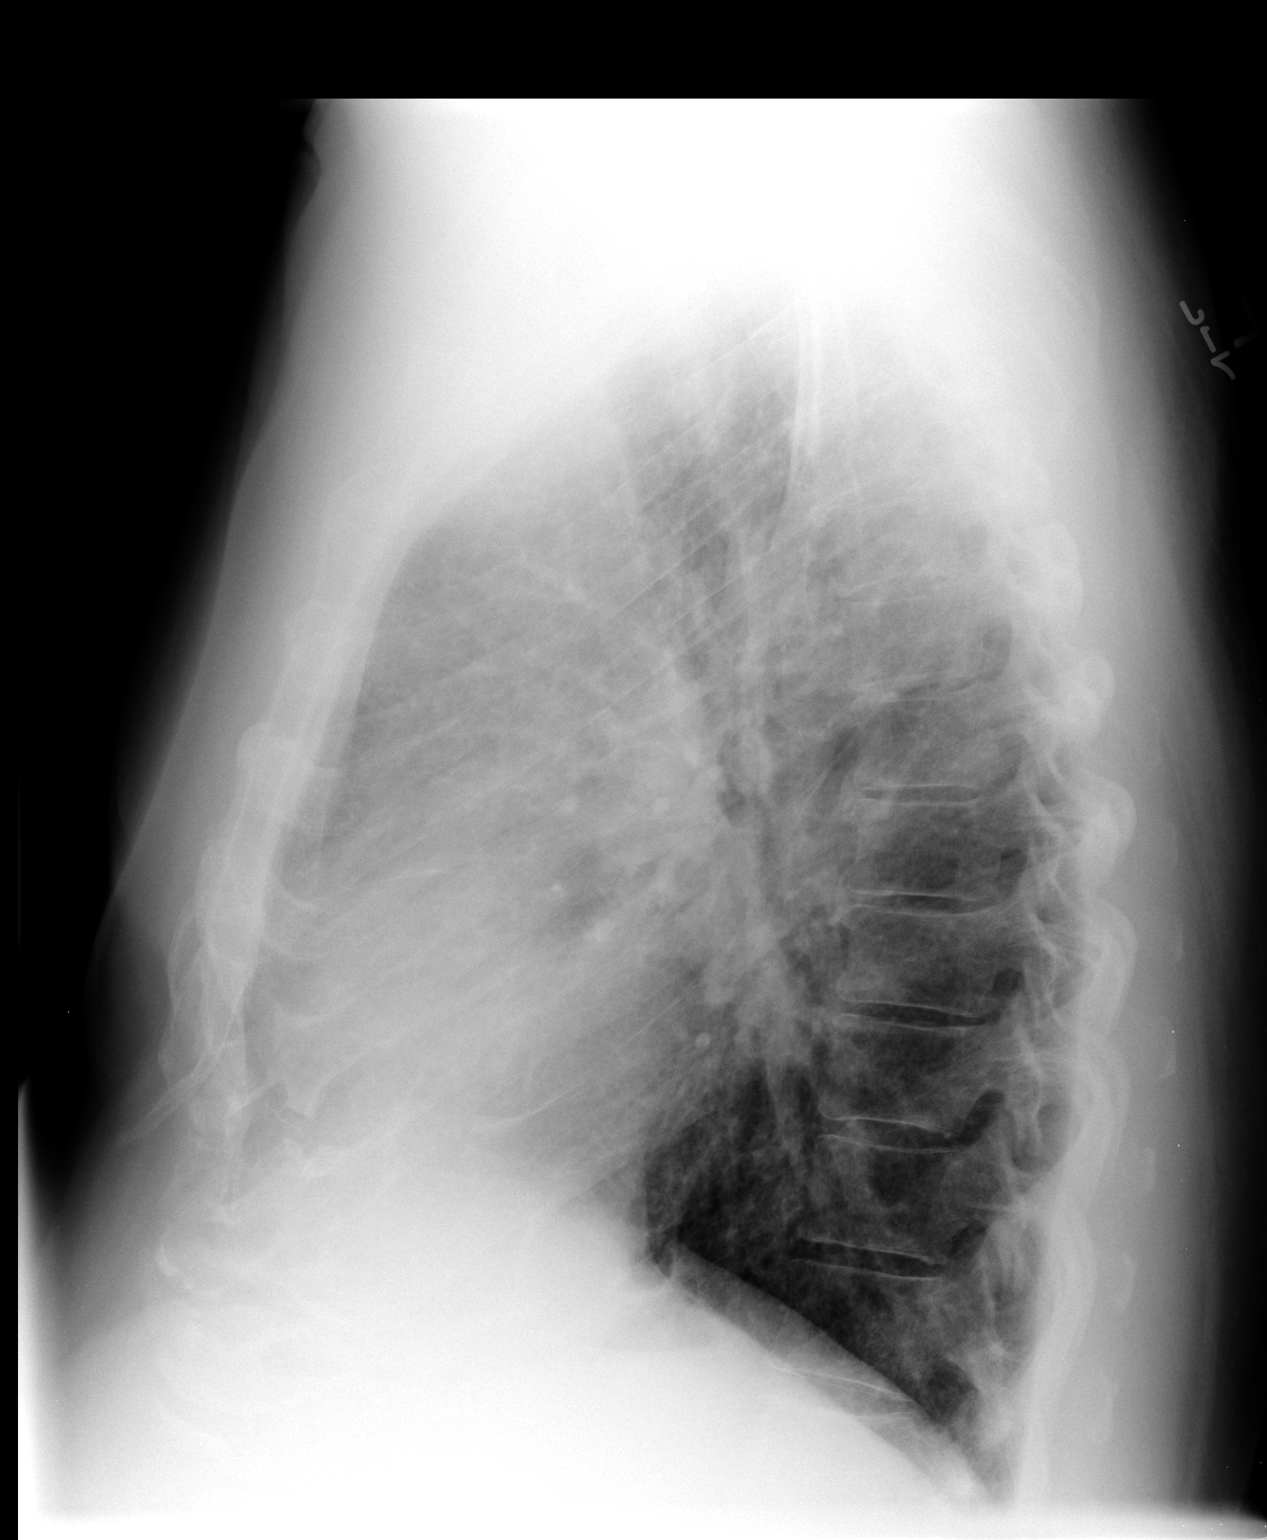

[2 of 2 positions shown; findings below may reference images not displayed]

FINDINGS: The heart is enlarged. Mediastinal shadows are normal. There are
abnormal interstitial lung markings likely to relate to chronic
interstitial lung disease. Interstitial edema is not excluded. No
effusions. No consolidation or collapse. No significant bony
finding.
IMPRESSION: Abnormal interstitial lung markings probably representing chronic
lung disease. Interstitial edema is the other possible explanation.

## 2015-07-26 ENCOUNTER — Encounter: Payer: Self-pay | Admitting: Family Medicine

## 2015-07-26 ENCOUNTER — Ambulatory Visit (INDEPENDENT_AMBULATORY_CARE_PROVIDER_SITE_OTHER): Payer: Commercial Managed Care - HMO | Admitting: Family Medicine

## 2015-07-26 VITALS — BP 118/82 | HR 93 | Temp 98.7°F | Ht 70.0 in | Wt 269.4 lb

## 2015-07-26 DIAGNOSIS — R739 Hyperglycemia, unspecified: Secondary | ICD-10-CM

## 2015-07-26 DIAGNOSIS — R110 Nausea: Secondary | ICD-10-CM | POA: Diagnosis not present

## 2015-07-26 NOTE — Progress Notes (Signed)
Pre visit review using our clinic review tool, if applicable. No additional management support is needed unless otherwise documented below in the visit note. 

## 2015-07-26 NOTE — Patient Instructions (Signed)
Nausea, Adult Nausea is the feeling that you have an upset stomach or have to vomit. Nausea by itself is not likely a serious concern, but it may be an early sign of more serious medical problems. As nausea gets worse, it can lead to vomiting. If vomiting develops, there is the risk of dehydration.  CAUSES   Viral infections.  Food poisoning.  Medicines.  Pregnancy.  Motion sickness.  Migraine headaches.  Emotional distress.  Severe pain from any source.  Alcohol intoxication. HOME CARE INSTRUCTIONS  Get plenty of rest.  Ask your caregiver about specific rehydration instructions.  Eat small amounts of food and sip liquids more often.  Take all medicines as told by your caregiver. SEEK MEDICAL CARE IF:  You have not improved after 2 days, or you get worse.  You have a headache. SEEK IMMEDIATE MEDICAL CARE IF:   You have a fever.  You faint.  You keep vomiting or have blood in your vomit.  You are extremely weak or dehydrated.  You have dark or bloody stools.  You have severe chest or abdominal pain. MAKE SURE YOU:  Understand these instructions.  Will watch your condition.  Will get help right away if you are not doing well or get worse.   This information is not intended to replace advice given to you by your health care provider. Make sure you discuss any questions you have with your health care provider.   Document Released: 02/15/2004 Document Revised: 01/28/2014 Document Reviewed: 09/19/2010 Elsevier Interactive Patient Education [employer]  We will set up Ultrasound of abdomen Let's get lab tomorrow

## 2015-07-26 NOTE — Progress Notes (Signed)
Subjective:    Patient ID: Wesley Castro, male    DOB: 1950/11/20, 65 y.o.   MRN: [id number]  HPI Patient is seen with approximately one-month history of nausea without vomiting. His nausea is fairly low grade but fairly persistent. He's had some decline in appetite but not much change in weight. He has not noted any significant pain other than when he presses on his right upper quadrant and epigastric area and has been very mild pain. No change in bowel habits. Denies constipation or diarrhea. Possibly exacerbated with eating. Especially after fried foods. Denies any GERD. No dysphagia. No chest pains. No headaches.  Chronic problems include history of obstructive sleep apnea, obesity, nicotine use, nonischemic cardiomyopathy, hyperlipidemia, combined systolic and diastolic heart failure, and hypertension. No recent change of medication. He has not been seen in this clinic since 2014.  Past Medical History  Diagnosis Date  . Chronic combined systolic and diastolic CHF (congestive heart failure) (HCC)     Echo 1/16: Mild LVH, EF 25-30%, diffuse HK, restrictive physiology, MAC, trivial MR, mild BAE;  b. Echo 2/17:  EF 30-35%, diff HK, mild MR, mild to mod LAE  . NICM (nonischemic cardiomyopathy) (HCC)     a. Myoview 1/16 - high risk, small apical scar with partial reversibility, EF 27%, NSVT, ATach >> b. LHC 1/16: no sig CAD, EF 25%  . HLD (hyperlipidemia)   . HTN (hypertension)   . Leukocytosis, unspecified   . Obesity, unspecified   . OSA (obstructive sleep apnea)   . NSVT (nonsustained ventricular tachycardia) (HCC)     noted during hospital admit in 2011 and during stress Nuc in 2016   Past Surgical History  Procedure Laterality Date  . Left heart catheterization with coronary angiogram N/A 02/16/2014    Procedure: LEFT HEART CATHETERIZATION WITH CORONARY ANGIOGRAM;  Surgeon: Corky Crafts, MD;  Location: [facility] CATH LAB;  Service: Cardiovascular;  Laterality: N/A;    reports  that he has been smoking.  He does not have any smokeless tobacco history on file. He reports that he drinks alcohol. He reports that he does not use illicit drugs. family history includes Lung cancer in his maternal grandfather; Ovarian cancer (age of onset: 66) in his mother. No Known Allergies     Review of Systems  Constitutional: Positive for appetite change. Negative for fever and chills.  HENT: Negative for trouble swallowing.   Respiratory: Negative for cough.   Cardiovascular: Positive for leg swelling (Chronic bilateral and unchanged). Negative for chest pain.  Gastrointestinal: Positive for nausea. Negative for vomiting, diarrhea, constipation and abdominal distention.  Genitourinary: Negative for dysuria.  Neurological: Negative for dizziness.       Objective:   Physical Exam  Constitutional: He appears well-developed and well-nourished.  HENT:  Mouth/Throat: Oropharynx is clear and moist.  Neck: Neck supple. No thyromegaly present.  Cardiovascular: Normal rate and regular rhythm.   Pulmonary/Chest: Effort normal and breath sounds normal. No respiratory distress. He has no wheezes. He has no rales.  Abdominal: Soft. Bowel sounds are normal. He exhibits no distension and no mass. There is no rebound and no guarding.  Mild tenderness with palpation right upper quadrant and epigastric region. No masses.  Musculoskeletal: He exhibits edema.  Lymphadenopathy:    He has no cervical adenopathy.          Assessment & Plan:  #1 One-month history of persistent nausea without vomiting. He also describes decline in appetite. Weight is down 9 pounds from visit with  cardiologist back in January. He has predominantly nausea with very minimal if any pain. He did seem slightly tender on palpation as above today. Differential should include gallstones, gastroparesis, versus other. Recommend the following -Check labs with CBC, comprehensive metabolic panel, lipase -Set up abdominal  ultrasound to further assess -Follow low-fat diet until further workup -follow up immediately for any fever, recurrent vomiting, or worsening pain. -consider GI referral if all above unrevealing.  #2 hyperglycemia. He had several blood sugars through cardiology which have been over 130 but not clear if these were all fasting. We recommended Hemoccult A1c.  Kristian Covey MD [facility] at [facility]

## 2015-07-27 ENCOUNTER — Other Ambulatory Visit (INDEPENDENT_AMBULATORY_CARE_PROVIDER_SITE_OTHER): Payer: Commercial Managed Care - HMO

## 2015-07-27 DIAGNOSIS — R110 Nausea: Secondary | ICD-10-CM | POA: Diagnosis not present

## 2015-07-27 DIAGNOSIS — R739 Hyperglycemia, unspecified: Secondary | ICD-10-CM | POA: Diagnosis not present

## 2015-07-27 LAB — COMPREHENSIVE METABOLIC PANEL
ALT: 10 U/L (ref 0–53)
AST: 18 U/L (ref 0–37)
Albumin: 3.8 g/dL (ref 3.5–5.2)
Alkaline Phosphatase: 67 U/L (ref 39–117)
BILIRUBIN TOTAL: 1.7 mg/dL — AB (ref 0.2–1.2)
BUN: 15 mg/dL (ref 6–23)
CALCIUM: 9.7 mg/dL (ref 8.4–10.5)
CO2: 34 meq/L — AB (ref 19–32)
Chloride: 97 mEq/L (ref 96–112)
Creatinine, Ser: 1.01 mg/dL (ref 0.40–1.50)
GFR: 78.83 mL/min (ref >60.00)
Glucose, Bld: 148 mg/dL — ABNORMAL HIGH (ref 70–99)
Potassium: 4.7 mEq/L (ref 3.5–5.1)
Sodium: 139 mEq/L (ref 135–145)
Total Protein: 7.2 g/dL (ref 6.0–8.3)

## 2015-07-27 LAB — CBC WITH DIFFERENTIAL/PLATELET
BASOS ABS: 0 10*3/uL (ref 0.0–0.1)
BASOS PCT: 0.4 % (ref 0.0–3.0)
Eosinophils Absolute: 0.2 10*3/uL (ref 0.0–0.7)
Eosinophils Relative: 2.6 % (ref 0.0–5.0)
HEMATOCRIT: 49.5 % (ref 39.0–52.0)
Hemoglobin: 16.3 g/dL (ref 13.0–17.0)
LYMPHS PCT: 18.5 % (ref 12.0–46.0)
Lymphs Abs: 1.5 10*3/uL (ref 0.7–4.0)
MCHC: 32.9 g/dL (ref 30.0–36.0)
MCV: 89 fl (ref 78.0–100.0)
Monocytes Absolute: 0.7 10*3/uL (ref 0.1–1.0)
Monocytes Relative: 8.4 % (ref 3.0–12.0)
Neutro Abs: 5.7 10*3/uL (ref 1.4–7.7)
Neutrophils Relative %: 70.1 % (ref 43.0–77.0)
PLATELETS: 141 10*3/uL — AB (ref 150.0–400.0)
RBC: 5.56 Mil/uL (ref 4.22–5.81)
RDW: 15.4 % (ref 11.5–15.5)
WBC: 8.1 10*3/uL (ref 4.0–10.5)

## 2015-07-27 LAB — HEMOGLOBIN A1C: Hgb A1c MFr Bld: 7.4 % — ABNORMAL HIGH (ref 4.6–6.5)

## 2015-07-27 LAB — LIPASE: Lipase: 39 U/L (ref 11.0–59.0)

## 2015-07-28 ENCOUNTER — Telehealth: Payer: Self-pay | Admitting: Family Medicine

## 2015-07-28 NOTE — Telephone Encounter (Signed)
Please see result notes.  

## 2015-07-28 NOTE — Telephone Encounter (Signed)
Pt returned your call for his results

## 2015-08-03 ENCOUNTER — Ambulatory Visit
Admission: RE | Admit: 2015-08-03 | Discharge: 2015-08-03 | Disposition: A | Discharge status: Home / Self Care | Payer: Commercial Managed Care - HMO | Source: Ambulatory Visit | Attending: Family Medicine | Admitting: Family Medicine

## 2015-08-03 DIAGNOSIS — R110 Nausea: Secondary | ICD-10-CM

## 2015-08-08 ENCOUNTER — Ambulatory Visit (INDEPENDENT_AMBULATORY_CARE_PROVIDER_SITE_OTHER): Payer: Commercial Managed Care - HMO | Admitting: Family Medicine

## 2015-08-08 ENCOUNTER — Encounter: Payer: Self-pay | Admitting: Family Medicine

## 2015-08-08 VITALS — BP 92/60 | HR 110 | Temp 98.1°F | Ht 70.0 in | Wt 268.0 lb

## 2015-08-08 DIAGNOSIS — R110 Nausea: Secondary | ICD-10-CM | POA: Diagnosis not present

## 2015-08-08 DIAGNOSIS — E119 Type 2 diabetes mellitus without complications: Secondary | ICD-10-CM | POA: Insufficient documentation

## 2015-08-08 DIAGNOSIS — IMO0001 Reserved for inherently not codable concepts without codable children: Secondary | ICD-10-CM

## 2015-08-08 DIAGNOSIS — E1165 Type 2 diabetes mellitus with hyperglycemia: Secondary | ICD-10-CM | POA: Diagnosis not present

## 2015-08-08 NOTE — Patient Instructions (Signed)
Try to lose some weight Stop colas Reduce sugars and starches. Lets plan on repeat A1C in about 3  Months.

## 2015-08-08 NOTE — Progress Notes (Signed)
Subjective:     Patient ID: Wesley Castro, male   DOB: 1950/08/03, 65 y.o.   MRN: [id number]  HPI Follow-up regarding recent nausea and weight loss. We obtained several labs and these were basically unremarkable with exception of hemoglobin A1c 7.4%. Abdominal ultrasound revealed no acute abnormalities. Fatty liver changes. Pancreas and gallbladder were normal. Patient states his wife had antinausea medicine (?Zofran) and he took just one dose and symptoms fully resolved. Never had any vomiting. Appetite still slightly diminished overall improving. No recent stool changes.  Never diagnosed with diabetes. Very poor compliance with diet. He drinks several colas per day and frequently eats desserts most days. No consistent exercise. Several fasting sugars over 126.  Past Medical History  Diagnosis Date  . Chronic combined systolic and diastolic CHF (congestive heart failure) (HCC)     Echo 1/16: Mild LVH, EF 25-30%, diffuse HK, restrictive physiology, MAC, trivial MR, mild BAE;  b. Echo 2/17:  EF 30-35%, diff HK, mild MR, mild to mod LAE  . NICM (nonischemic cardiomyopathy) (HCC)     a. Myoview 1/16 - high risk, small apical scar with partial reversibility, EF 27%, NSVT, ATach >> b. LHC 1/16: no sig CAD, EF 25%  . HLD (hyperlipidemia)   . HTN (hypertension)   . Leukocytosis, unspecified   . Obesity, unspecified   . OSA (obstructive sleep apnea)   . NSVT (nonsustained ventricular tachycardia) (HCC)     noted during hospital admit in 2011 and during stress Nuc in 2016   Past Surgical History  Procedure Laterality Date  . Left heart catheterization with coronary angiogram N/A 02/16/2014    Procedure: LEFT HEART CATHETERIZATION WITH CORONARY ANGIOGRAM;  Surgeon: Corky Crafts, MD;  Location: [facility] CATH LAB;  Service: Cardiovascular;  Laterality: N/A;    reports that he has been smoking.  He does not have any smokeless tobacco history on file. He reports that he drinks alcohol. He reports that  he does not use illicit drugs. family history includes Lung cancer in his maternal grandfather; Ovarian cancer (age of onset: 34) in his mother. No Known Allergies   Review of Systems  Constitutional: Negative for chills, fatigue and unexpected weight change.  Eyes: Negative for visual disturbance.  Respiratory: Negative for cough, chest tightness and shortness of breath.   Cardiovascular: Positive for leg swelling. Negative for chest pain and palpitations.  Gastrointestinal: Negative for abdominal pain.  Endocrine: Negative for polydipsia and polyuria.  Neurological: Negative for dizziness, syncope, weakness, light-headedness and headaches.       Objective:   Physical Exam  Constitutional: He appears well-developed and well-nourished.  Cardiovascular: Normal rate and regular rhythm.   Pulmonary/Chest: Effort normal and breath sounds normal. No respiratory distress. He has no wheezes. He has no rales.  Abdominal: Soft. Bowel sounds are normal. He exhibits no distension and no mass. There is no tenderness. There is no rebound and no guarding.  Musculoskeletal: He exhibits no edema.       Assessment:     #1 recent nausea. No vomiting. Etiology unclear. Symptoms improve after taking Zofran. Recent evaluation as above unrevealing.  Recent x-ray and labs reviewed with patient.  #2 type 2 diabetes. Recent diagnosis. Patient does not have symptoms of polyuria or polydipsia    Plan:     -Follow-up for any recurrent nausea -Strongly advise weight loss. -Reduce sugars and starches. Eliminate colas -We discussed possible initiation of metformin and he prefers to make some lifestyle changes and evaluate A1c in 3 months  Kristian Covey MD [facility] at [facility]

## 2015-08-08 NOTE — Progress Notes (Signed)
Pre visit review using our clinic review tool, if applicable. No additional management support is needed unless otherwise documented below in the visit note. 

## 2015-09-05 ENCOUNTER — Ambulatory Visit (INDEPENDENT_AMBULATORY_CARE_PROVIDER_SITE_OTHER): Payer: Medicare Other | Admitting: Family Medicine

## 2015-09-05 ENCOUNTER — Encounter: Payer: Self-pay | Admitting: Family Medicine

## 2015-09-05 VITALS — BP 100/50 | HR 45 | Temp 98.2°F | Ht 70.0 in | Wt 287.7 lb

## 2015-09-05 DIAGNOSIS — I5042 Chronic combined systolic (congestive) and diastolic (congestive) heart failure: Secondary | ICD-10-CM

## 2015-09-05 DIAGNOSIS — R635 Abnormal weight gain: Secondary | ICD-10-CM | POA: Diagnosis not present

## 2015-09-05 DIAGNOSIS — R0609 Other forms of dyspnea: Secondary | ICD-10-CM | POA: Diagnosis not present

## 2015-09-05 MED ORDER — POTASSIUM CHLORIDE ER 10 MEQ PO TBCR: 10.0000 meq | EXTENDED_RELEASE_TABLET | Freq: Every day | ORAL | 6 refills | Status: AC

## 2015-09-05 NOTE — Progress Notes (Signed)
Subjective:     Patient ID: Wesley Castro, male   DOB: March 24, 1950, 65 y.o.   MRN: [id number]  HPI Patient seen with progressive bilateral leg edema, dyspnea, weight gain over the past few weeks. His weight has increased tremendously from 268 pounds last visit to 287 pounds today. He has history of known nonischemic cardiomyopathy. He had catheterization earlier this year which showed no critical obstruction and no major coronary disease. Most recent echo ejection fraction < 35%  Current medications include aspirin, carvedilol 3.125 mg bid, Lasix 40 mg daily, lisinopril 10 mg daily, and pravastatin 40 mg daily. He states he is compliant with medications. No recent dietary changes. Denies recent nonsteroidal use. No recent alcohol binge.  Past Medical History:  Diagnosis Date  . Chronic combined systolic and diastolic CHF (congestive heart failure) (HCC)    Echo 1/16: Mild LVH, EF 25-30%, diffuse HK, restrictive physiology, MAC, trivial MR, mild BAE;  b. Echo 2/17:  EF 30-35%, diff HK, mild MR, mild to mod LAE  . HLD (hyperlipidemia)   . HTN (hypertension)   . Leukocytosis, unspecified   . NICM (nonischemic cardiomyopathy) (HCC)    a. Myoview 1/16 - high risk, small apical scar with partial reversibility, EF 27%, NSVT, ATach >> b. LHC 1/16: no sig CAD, EF 25%  . NSVT (nonsustained ventricular tachycardia) (HCC)    noted during hospital admit in 2011 and during stress Nuc in 2016  . Obesity, unspecified   . OSA (obstructive sleep apnea)    Past Surgical History:  Procedure Laterality Date  . LEFT HEART CATHETERIZATION WITH CORONARY ANGIOGRAM N/A 02/16/2014   Procedure: LEFT HEART CATHETERIZATION WITH CORONARY ANGIOGRAM;  Surgeon: Corky CraftsJayadeep S Varanasi, MD;  [facility];  Service: Cardiovascular;  Laterality: N/A;    reports that he has been smoking.  He does not have any smokeless tobacco history on file. He reports that he drinks alcohol. He reports that he does not use drugs. family  history includes Lung cancer in his maternal grandfather; Ovarian cancer (age of onset: 4443) in his mother. No Known Allergies   Review of Systems  Constitutional: Positive for fatigue and unexpected weight change. Negative for chills and fever.  Respiratory: Positive for shortness of breath. Negative for cough, chest tightness and wheezing.   Cardiovascular: Positive for leg swelling. Negative for chest pain and palpitations.  Neurological: Negative for dizziness and syncope.  Psychiatric/Behavioral: Negative for confusion.       Objective:   Physical Exam  Constitutional: He appears well-developed and well-nourished.  Neck: Neck supple.  Cardiovascular: Normal rate and regular rhythm.   Pulmonary/Chest: Effort normal.  Patient has rales in both bases. No respiratory distress at rest.  Musculoskeletal: He exhibits edema.  Pitting edema legs bilaterally. He has pitting edema all the way up to the abdomen region  Neurological: He is alert.       Assessment:     Patient has known history of nonischemic cardiomyopathy with combined systolic and diastolic heart failure and presents with progressive bilateral leg edema, weight gain, orthopnea and exertional dyspnea over the past few weeks.    Plan:     -Check BNP and basic metabolic panel -Increase furosemide to 80 mg tonight, 80 mg tomorrow morning, and then 40 mg twice a day until follow-up -Recommend frequent leg elevation -Watch sodium intake -Start K-Lor 10 once daily -Reassess in 3 days and sooner as needed. -We explained he may need hospitalization for stabilization if he has any progressive dyspnea-or not  improving over the next couple days -consider EP referral to discuss possible ICD -will discuss getting him into heart failure clinic.  Kristian Covey MD  Primary Care at [facility]

## 2015-09-05 NOTE — Progress Notes (Signed)
Pre visit review using our clinic review tool, if applicable. No additional management support is needed unless otherwise documented below in the visit note. 

## 2015-09-05 NOTE — Patient Instructions (Addendum)
Heart Failure Heart failure is a condition in which the heart has trouble pumping blood. This means your heart does not pump blood efficiently for your body to work well. In some cases of heart failure, fluid may back up into your lungs or you may have swelling (edema) in your lower legs. Heart failure is usually a long-term (chronic) condition. It is important for you to take good care of yourself and follow your health care provider's treatment plan. CAUSES  Some health conditions can cause heart failure. Those health conditions include:  High blood pressure (hypertension). Hypertension causes the heart muscle to work harder than normal. When pressure in the blood vessels is high, the heart needs to pump (contract) with more force in order to circulate blood throughout the body. High blood pressure eventually causes the heart to become stiff and weak.  Coronary artery disease (CAD). CAD is the buildup of cholesterol and fat (plaque) in the arteries of the heart. The blockage in the arteries deprives the heart muscle of oxygen and blood. This can cause chest pain and may lead to a heart attack. High blood pressure can also contribute to CAD.  Heart attack (myocardial infarction). A heart attack occurs when one or more arteries in the heart become blocked. The loss of oxygen damages the muscle tissue of the heart. When this happens, part of the heart muscle dies. The injured tissue does not contract as well and weakens the heart's ability to pump blood.  Abnormal heart valves. When the heart valves do not open and close properly, it can cause heart failure. This makes the heart muscle pump harder to keep the blood flowing.  Heart muscle disease (cardiomyopathy or myocarditis). Heart muscle disease is damage to the heart muscle from a variety of causes. These can include drug or alcohol abuse, infections, or unknown reasons. These can increase the risk of heart failure.  Lung disease. Lung disease  makes the heart work harder because the lungs do not work properly. This can cause a strain on the heart, leading it to fail.  Diabetes. Diabetes increases the risk of heart failure. High blood sugar contributes to high fat (lipid) levels in the blood. Diabetes can also cause slow damage to tiny blood vessels that carry important nutrients to the heart muscle. When the heart does not get enough oxygen and food, it can cause the heart to become weak and stiff. This leads to a heart that does not contract efficiently.  Other conditions can contribute to heart failure. These include abnormal heart rhythms, thyroid problems, and low blood counts (anemia). Certain unhealthy behaviors can increase the risk of heart failure, including:  Being overweight.  Smoking or chewing tobacco.  Eating foods high in fat and cholesterol.  Abusing illicit drugs or alcohol.  Lacking physical activity. SYMPTOMS  Heart failure symptoms may vary and can be hard to detect. Symptoms may include:  Shortness of breath with activity, such as climbing stairs.  Persistent cough.  Swelling of the feet, ankles, legs, or abdomen.  Unexplained weight gain.  Difficulty breathing when lying flat (orthopnea).  Waking from sleep because of the need to sit up and get more air.  Rapid heartbeat.  Fatigue and loss of energy.  Feeling light-headed, dizzy, or close to fainting.  Loss of appetite.  Nausea.  Increased urination during the night (nocturia). DIAGNOSIS  A diagnosis of heart failure is based on your history, symptoms, physical examination, and diagnostic tests. Diagnostic tests for heart failure may include:  Echocardiography. °· Electrocardiography. °· Chest X-ray. °· Blood tests. °· Exercise stress test. °· Cardiac angiography. °· Radionuclide scans. °TREATMENT  °Treatment is aimed at managing the symptoms of heart failure. Medicines, behavioral changes, or surgical intervention may be necessary to  treat heart failure. °· Medicines to help treat heart failure may include: °¨ Angiotensin-converting enzyme (ACE) inhibitors. This type of medicine blocks the effects of a blood protein called angiotensin-converting enzyme. ACE inhibitors relax (dilate) the blood vessels and help lower blood pressure. °¨ Angiotensin receptor blockers (ARBs). This type of medicine blocks the actions of a blood protein called angiotensin. Angiotensin receptor blockers dilate the blood vessels and help lower blood pressure. °¨ Water pills (diuretics). Diuretics cause the kidneys to remove salt and water from the blood. The extra fluid is removed through urination. This loss of extra fluid lowers the volume of blood the heart pumps. °¨ Beta blockers. These prevent the heart from beating too fast and improve heart muscle strength. °¨ Digitalis. This increases the force of the heartbeat. °· Healthy behavior changes include: °¨ Obtaining and maintaining a healthy weight. °¨ Stopping smoking or chewing tobacco. °¨ Eating heart-healthy foods. °¨ Limiting or avoiding alcohol. °¨ Stopping illicit drug use. °¨ Physical activity as directed by your health care provider. °· Surgical treatment for heart failure may include: °¨ A procedure to open blocked arteries, repair damaged heart valves, or remove damaged heart muscle tissue. °¨ A pacemaker to improve heart muscle function and control certain abnormal heart rhythms. °¨ An internal cardioverter defibrillator to treat certain serious abnormal heart rhythms. °¨ A left ventricular assist device (LVAD) to assist the pumping ability of the heart. °HOME CARE INSTRUCTIONS  °· Take medicines only as directed by your health care provider. Medicines are important in reducing the workload of your heart, slowing the progression of heart failure, and improving your symptoms. °¨ Do not stop taking your medicine unless directed by your health care provider. °¨ Do not skip any dose of medicine. °¨ Refill your  prescriptions before you run out of medicine. Your medicines are needed every day. °· Engage in moderate physical activity if directed by your health care provider. Moderate physical activity can benefit some people. The elderly and people with severe heart failure should consult with a health care provider for physical activity recommendations. °· Eat heart-healthy foods. Food choices should be free of trans fat and low in saturated fat, cholesterol, and salt (sodium). Healthy choices include fresh or frozen fruits and vegetables, fish, lean meats, legumes, fat-free or low-fat dairy products, and whole grain or high fiber foods. Talk to a dietitian to learn more about heart-healthy foods. °· Limit sodium if directed by your health care provider. Sodium restriction may reduce symptoms of heart failure in some people. Talk to a dietitian to learn more about heart-healthy seasonings. °· Use healthy cooking methods. Healthy cooking methods include roasting, grilling, broiling, baking, poaching, steaming, or stir-frying. Talk to a dietitian to learn more about healthy cooking methods. °· Limit fluids if directed by your health care provider. Fluid restriction may reduce symptoms of heart failure in some people. °· Weigh yourself every day. Daily weights are important in the early recognition of excess fluid. You should weigh yourself every morning after you urinate and before you eat breakfast. Wear the same amount of clothing each time you weigh yourself. Record your daily weight. Provide your health care provider with your weight record. °· Monitor and record your blood pressure if directed by your health care   provider.  Check your pulse if directed by your health care provider.  Lose weight if directed by your health care provider. Weight loss may reduce symptoms of heart failure in some people.  Stop smoking or chewing tobacco. Nicotine makes your heart work harder by causing your blood vessels to constrict.  Do not use nicotine gum or patches before talking to your health care provider.  Keep all follow-up visits as directed by your health care provider. This is important.  Limit alcohol intake to no more than 1 drink per day for nonpregnant women and 2 drinks per day for men. One drink equals 12 ounces of beer, 5 ounces of wine, or 1 ounces of hard liquor. Drinking more than that is harmful to your heart. Tell your health care provider if you drink alcohol several times a week. Talk with your health care provider about whether alcohol is safe for you. If your heart has already been damaged by alcohol or you have severe heart failure, drinking alcohol should be stopped completely.  Stop illicit drug use.  Stay up-to-date with immunizations. It is especially important to prevent respiratory infections through current pneumococcal and influenza immunizations.  Manage other health conditions such as hypertension, diabetes, thyroid disease, or abnormal heart rhythms as directed by your health care provider.  Learn to manage stress.  Plan rest periods when fatigued.  Learn strategies to manage high temperatures. If the weather is extremely hot:  Avoid vigorous physical activity.  Use air conditioning or fans or seek a cooler location.  Avoid caffeine and alcohol.  Wear loose-fitting, lightweight, and light-colored clothing.  Learn strategies to manage cold temperatures. If the weather is extremely cold:  Avoid vigorous physical activity.  Layer clothes.  Wear mittens or gloves, a hat, and a scarf when going outside.  Avoid alcohol.  Obtain ongoing education and support as needed.  Participate in or seek rehabilitation as needed to maintain or improve independence and quality of life. SEEK MEDICAL CARE IF:   You have a rapid weight gain.  You have increasing shortness of breath that is unusual for you.  You are unable to participate in your usual physical activities.  You tire  easily.  You cough more than normal, especially with physical activity.  You have any or more swelling in areas such as your hands, feet, ankles, or abdomen.  You are unable to sleep because it is hard to breathe.  You feel like your heart is beating fast (palpitations).  You become dizzy or light-headed upon standing up. SEEK IMMEDIATE MEDICAL CARE IF:   You have difficulty breathing.  There is a change in mental status such as decreased alertness or difficulty with concentration.  You have a pain or discomfort in your chest.  You have an episode of fainting (syncope). MAKE SURE YOU:   Understand these instructions.  Will watch your condition.  Will get help right away if you are not doing well or get worse.   This information is not intended to replace advice given to you by your health care provider. Make sure you discuss any questions you have with your health care provider.   Document Released: 01/07/2005 Document Revised: 05/24/2014 Document Reviewed: 02/07/2012 Elsevier Interactive Patient Education 2016 [employer]  Take 2 Furosemide tablets tonight Take 2 Furosemide tablets tomorrow morning and then Go to one tablet twice until follow up. Elevate legs frequently.

## 2015-09-08 ENCOUNTER — Encounter: Payer: Self-pay | Admitting: Family Medicine

## 2015-09-08 ENCOUNTER — Ambulatory Visit (INDEPENDENT_AMBULATORY_CARE_PROVIDER_SITE_OTHER): Payer: Medicare Other | Admitting: Family Medicine

## 2015-09-08 VITALS — BP 90/60 | HR 84 | Temp 97.6°F | Ht 70.0 in | Wt 284.0 lb

## 2015-09-08 DIAGNOSIS — I428 Other cardiomyopathies: Secondary | ICD-10-CM

## 2015-09-08 DIAGNOSIS — I5042 Chronic combined systolic (congestive) and diastolic (congestive) heart failure: Secondary | ICD-10-CM

## 2015-09-08 DIAGNOSIS — I429 Cardiomyopathy, unspecified: Secondary | ICD-10-CM | POA: Diagnosis not present

## 2015-09-08 LAB — BRAIN NATRIURETIC PEPTIDE: Pro B Natriuretic peptide (BNP): 865 pg/mL — ABNORMAL HIGH (ref 0.0–100.0)

## 2015-09-08 LAB — BASIC METABOLIC PANEL
BUN: 20 mg/dL (ref 6–23)
CALCIUM: 9.1 mg/dL (ref 8.4–10.5)
CO2: 35 meq/L — AB (ref 19–32)
Chloride: 99 mEq/L (ref 96–112)
Creatinine, Ser: 1.13 mg/dL (ref 0.40–1.50)
GFR: 69.22 mL/min (ref >60.00)
Glucose, Bld: 106 mg/dL — ABNORMAL HIGH (ref 70–99)
POTASSIUM: 4.1 meq/L (ref 3.5–5.1)
SODIUM: 140 meq/L (ref 135–145)

## 2015-09-08 MED ORDER — DOXYCYCLINE HYCLATE 100 MG PO CAPS: 100.0000 mg | ORAL_CAPSULE | Freq: Two times a day (BID) | ORAL | 0 refills | Status: DC

## 2015-09-08 NOTE — Patient Instructions (Signed)
Take two 40 mg Lasix twice daily until follow Take one potassium tablets twice daily We will be getting you back in to see Cardiology

## 2015-09-08 NOTE — Progress Notes (Signed)
Pre visit review using our clinic review tool, if applicable. No additional management support is needed unless otherwise documented below in the visit note. 

## 2015-09-08 NOTE — Progress Notes (Signed)
Subjective:     Patient ID: Wesley Castro, male   DOB: [birth year], 65 y.o.   MRN: [id number]  HPI Patient seen for follow-up regarding CHF. Refer to last note. His weight had increased fairly dramatically up about 20 pounds over a short period in this patient with known history of nonischemic cardiomyopathy. We increased his furosemide to 80 mg in the morning and 40 mg in afternoon for couple days and then drop him back to 40 mg twice a day. He states he lost 10 pounds lost immediately but over the past day or so he has gained back some of the weight. He still has some dyspnea with exertion. He has orthopnea. No chest pains. Remains also on carvedilol and lisinopril. Patient never went for labs he other day. We had ordered BNP and basic metabolic panel  Past Medical History:  Diagnosis Date  . Chronic combined systolic and diastolic CHF (congestive heart failure) (HCC)    Echo 1/16: Mild LVH, EF 25-30%, diffuse HK, restrictive physiology, MAC, trivial MR, mild BAE;  b. Echo 2/17:  EF 30-35%, diff HK, mild MR, mild to mod LAE  . HLD (hyperlipidemia)   . HTN (hypertension)   . Leukocytosis, unspecified   . NICM (nonischemic cardiomyopathy) (HCC)    a. Myoview 1/16 - high risk, small apical scar with partial reversibility, EF 27%, NSVT, ATach >> b. LHC 1/16: no sig CAD, EF 25%  . NSVT (nonsustained ventricular tachycardia) (HCC)    noted during hospital admit in 2011 and during stress Nuc in 2016  . Obesity, unspecified   . OSA (obstructive sleep apnea)    Past Surgical History:  Procedure Laterality Date  . LEFT HEART CATHETERIZATION WITH CORONARY ANGIOGRAM N/A 02/16/2014   Procedure: LEFT HEART CATHETERIZATION WITH CORONARY ANGIOGRAM;  Surgeon: Corky Crafts, MD;  Location: [facility] CATH LAB;  Service: Cardiovascular;  Laterality: N/A;    reports that he has been smoking.  He does not have any smokeless tobacco history on file. He reports that he drinks alcohol. He reports that he does not use  drugs. family history includes Lung cancer in his maternal grandfather; Ovarian cancer (age of onset: 31) in his mother. No Known Allergies   Review of Systems  Respiratory: Positive for shortness of breath. Negative for cough.   Cardiovascular: Positive for leg swelling. Negative for chest pain and palpitations.  Neurological: Negative for dizziness and syncope.       Objective:   Physical Exam  Constitutional: He appears well-developed and well-nourished.  Cardiovascular: Normal rate and regular rhythm.   Pulmonary/Chest: Effort normal and breath sounds normal. No respiratory distress. He has no wheezes. He has no rales.  Musculoskeletal: He exhibits edema.  Patient still has 2+ pitting edema legs all that to the abdomen. He has some mild weeping edema of the lower legs but no ulcerations       Assessment:     Combined systolic and diastolic heart failure-nonischemic with recent exacerbation    Plan:     -We'll refer to heart failure clinic -Increased furosemide 80 mg twice a day through the weekend -Get labs today as above -Elevate legs frequently -Increase potassium to one twice daily -Reassess Monday. Recommend ER follow-up in the meantime if he has any increased dyspnea or other concerns  Kristian Covey MD [place] [facility] at [facility]

## 2015-09-11 ENCOUNTER — Encounter: Payer: Self-pay | Admitting: Family Medicine

## 2015-09-11 ENCOUNTER — Ambulatory Visit (INDEPENDENT_AMBULATORY_CARE_PROVIDER_SITE_OTHER): Payer: Medicare Other | Admitting: Family Medicine

## 2015-09-11 VITALS — HR 74 | Temp 97.6°F | Wt 277.5 lb

## 2015-09-11 DIAGNOSIS — I10 Essential (primary) hypertension: Secondary | ICD-10-CM

## 2015-09-11 DIAGNOSIS — I5042 Chronic combined systolic (congestive) and diastolic (congestive) heart failure: Secondary | ICD-10-CM | POA: Diagnosis not present

## 2015-09-11 NOTE — Patient Instructions (Signed)
Continue with Furosemide 80 mg twice daily until follow up Elevate legs frequently You should get a call regarding cardiology follow up.

## 2015-09-11 NOTE — Progress Notes (Signed)
Subjective:     Patient ID: Wesley Barry Sr., male   DOB: 11-01-1950, 65 y.o.   MRN: [id number]  HPI Patient seen for follow-up regarding recent weight gain with increased bilateral leg edema and exacerbation of heart failure. He had gained approximately 20 pounds recently. Catheterization early this year showed no critical obstruction from CAD with ejection fraction less than 35%.  Recent BNP over 800. Electrolytes stable. Last visit, we increased his furosemide 80 mg twice a day. His weight is down about 7 pounds from last visit. He is somewhat less dyspneic but still has dyspnea with activity and has some orthopnea. Still has significant edema both legs and lower abdomen. We made referral to heart failure clinic and he has not heard back yet. No recent chest pains. Compliant with medications  Past Medical History:  Diagnosis Date  . Chronic combined systolic and diastolic CHF (congestive heart failure) (HCC)    Echo 1/16: Mild LVH, EF 25-30%, diffuse HK, restrictive physiology, MAC, trivial MR, mild BAE;  b. Echo 2/17:  EF 30-35%, diff HK, mild MR, mild to mod LAE  . HLD (hyperlipidemia)   . HTN (hypertension)   . Leukocytosis, unspecified   . NICM (nonischemic cardiomyopathy) (HCC)    a. Myoview 1/16 - high risk, small apical scar with partial reversibility, EF 27%, NSVT, ATach >> b. LHC 1/16: no sig CAD, EF 25%  . NSVT (nonsustained ventricular tachycardia) (HCC)    noted during hospital admit in 2011 and during stress Nuc in 2016  . Obesity, unspecified   . OSA (obstructive sleep apnea)    Past Surgical History:  Procedure Laterality Date  . LEFT HEART CATHETERIZATION WITH CORONARY ANGIOGRAM N/A 02/16/2014   Procedure: LEFT HEART CATHETERIZATION WITH CORONARY ANGIOGRAM;  Surgeon: Corky Crafts, MD;  Location: [facility] CATH LAB;  Service: Cardiovascular;  Laterality: N/A;    reports that he has been smoking.  He does not have any smokeless tobacco history on file. He reports that he  drinks alcohol. He reports that he does not use drugs. family history includes Lung cancer in his maternal grandfather; Ovarian cancer (age of onset: 21) in his mother. No Known Allergies   Review of Systems  Constitutional: Negative for appetite change and unexpected weight change.  Respiratory: Positive for shortness of breath. Negative for wheezing.   Cardiovascular: Positive for leg swelling. Negative for chest pain and palpitations.  Neurological: Negative for dizziness and syncope.  Psychiatric/Behavioral: Negative for confusion.       Objective:   Physical Exam  Constitutional: He is oriented to person, place, and time. He appears well-developed and well-nourished.  Neck: Neck supple.  Cardiovascular: Normal rate and regular rhythm.   Pulmonary/Chest: Effort normal. No respiratory distress. He has no wheezes.  Few faint crackles in both bases. No respiratory distress at rest  Musculoskeletal: He exhibits edema.  Still has tense edema both legs  Neurological: He is alert and oriented to person, place, and time.       Assessment:     Systolic heart failure with recent acute exacerbation. Symptomatically improved only slightly with increased dosage of furosemide.  His weight is down 10 pounds overall.    Plan:     -Continue furosemide 80 mg twice a day -Continue potassium replacement -Elevate legs frequently -What sodium intake -Referral has been made to heart failure clinic -Reassess one week and recheck basic metabolic panel then -?Entresto candidate.  Kristian Covey MD [place] [facility] at [facility]

## 2015-09-11 NOTE — Progress Notes (Signed)
Pre visit review using our clinic review tool, if applicable. No additional management support is needed unless otherwise documented below in the visit note. 

## 2015-09-12 ENCOUNTER — Telehealth: Payer: Self-pay | Admitting: Family Medicine

## 2015-09-12 NOTE — Telephone Encounter (Signed)
I spoke with his wife.  She states he stated before he went to bed last night that he was feeing better.  She did not find him until this afternoon.  We had put in referral to heart failure clinic and I explained to wife we would cancel that for her. I offered her any support we could give.

## 2015-09-12 NOTE — Telephone Encounter (Signed)
Pt passed in the night last night in his sleep. Wife was working around the house this am and did not want to bother him. Found him this afternoon around 2:30 pm.  Pt will be going to [employer] home in [place], [place]

## 2015-09-14 ENCOUNTER — Ambulatory Visit: Payer: Medicare Other | Admitting: Physician Assistant

## 2015-09-15 ENCOUNTER — Telehealth: Payer: Self-pay | Admitting: Family Medicine

## 2015-09-15 NOTE — Telephone Encounter (Signed)
Pts wife Darl Pikes) is calling b/c family members (sons) are wanting to have a private autopsy b/c they have the same health issues that their father had.  They would like to have a call or appointment to let them know what really happened to their father.  Wife would like to see if you know of any private persons that would do a autopsy.  Pts wife would like to have closer by the end of next week if possible.

## 2015-09-15 NOTE — Telephone Encounter (Signed)
[facility] - Client TELEPHONE ADVICE RECORD [facility] Patient Name: Wesley Castro DOB: 1950-06-07 Initial Comment caller states husband died Tues and his son wants an autopsy and she has questions Nurse Assessment Nurse: Debera Lat, RN, Tinnie Gens Date/Time (Eastern Time): 09/15/2015 9:47:26 AM Confirm and document reason for call. If symptomatic, describe symptoms. You must click the next button to save text entered. ---Caller states husband died Tues and his son wants an autopsy and she has questions. Has the patient traveled out of the country within the last 30 days? ---No Does the patient have any new or worsening symptoms? ---No Please document clinical information provided and list any resource used. ---Upon the death of any person resulting from violence, poisoning, accident, suicide, or homicide; occurring suddenly when the deceased had been in apparent good health or when unattended by a physician; occurring in a jail, prison, correctional institute, or in police custody; or occurring under any suspicious, unusual or unnatural circumstance, the medical examiner of the county in which the deceased is found shall be notified - [place] General Statutes  [phone] Advised caller that she should contact office about additional medical records from [facility]. Guidelines Guideline Title Affirmed Question Affirmed Notes Final Disposition User Clinical Call Debera Lat, RN, Tinnie Gens

## 2015-09-16 NOTE — Telephone Encounter (Signed)
I spoke at length with pt's wife and son [name]) and tried to answer any questions.  Son is trying to decide whether to pursue autopsy- to have further information regarding family hx.

## 2015-09-18 ENCOUNTER — Ambulatory Visit: Payer: Medicare Other | Admitting: Family Medicine

## 2015-09-22 DEATH — deceased

## 2015-11-08 ENCOUNTER — Ambulatory Visit: Payer: Commercial Managed Care - HMO | Admitting: Family Medicine

## 2017-03-20 IMAGING — US US ABDOMEN COMPLETE
1 series · 14 of 25 positions shown · non-contrast
Comparison: None.

CLINICAL DATA: Nausea, vomiting, right upper quadrant abdomen pain
for 1 month.

EXAM:
ABDOMEN ULTRASOUND COMPLETE

[Series 1: us abdomen complete · 0.40mm/px · 14 of 118 slices shown]
[im 1/118]
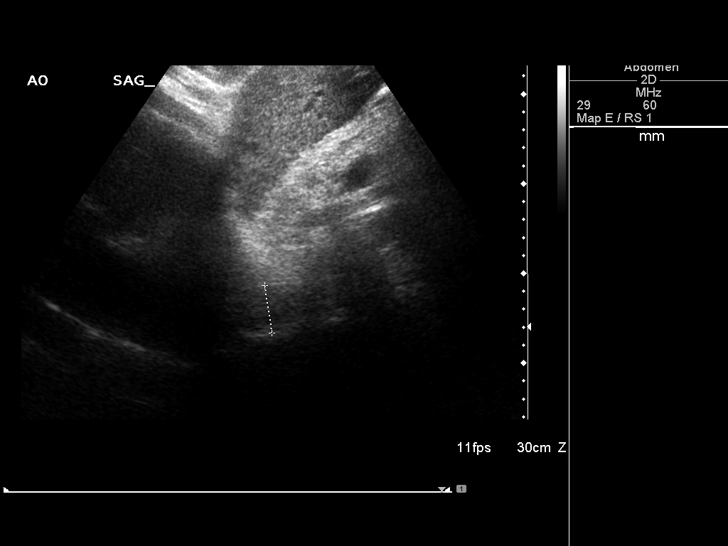
[im 10/118]
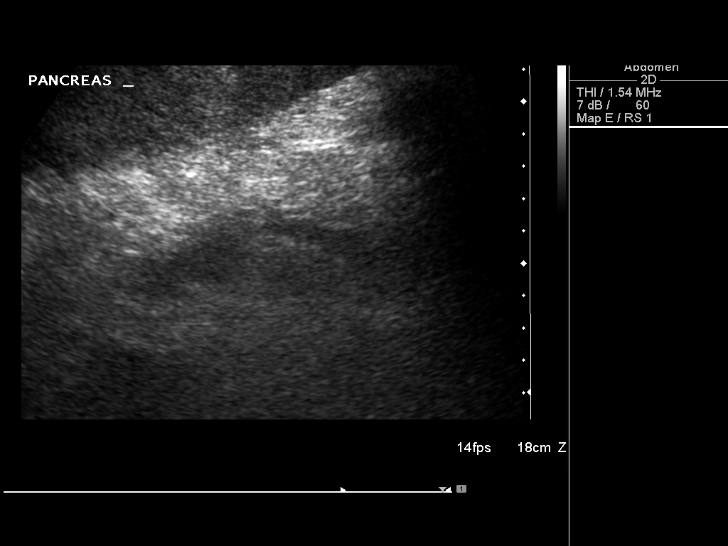
[im 20/118]
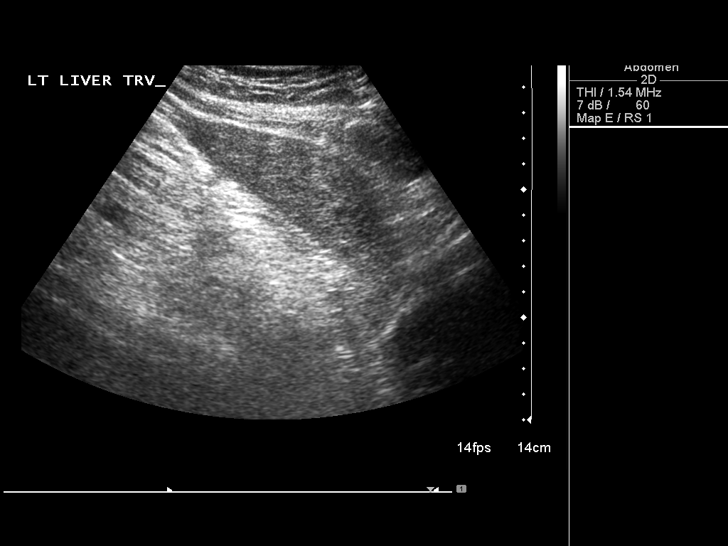
[im 30/118]
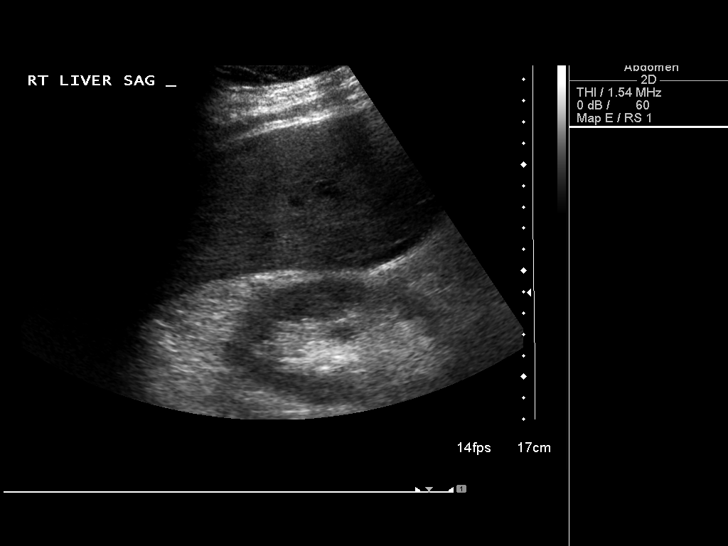
[im 40/118]
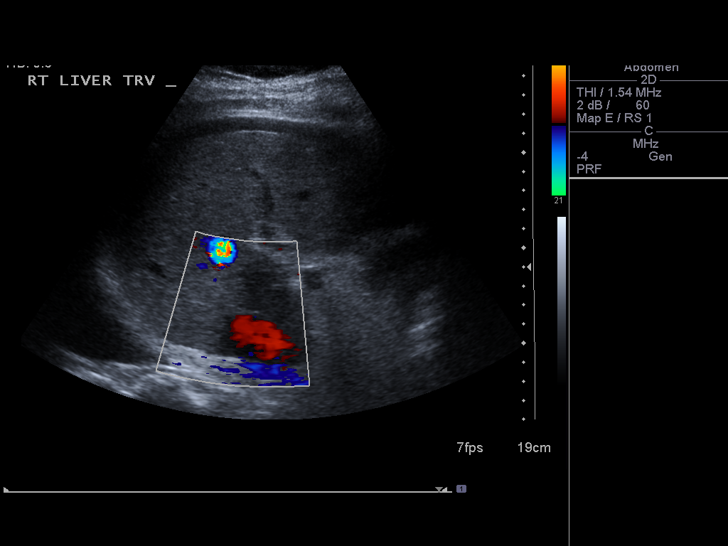
[im 44/118]
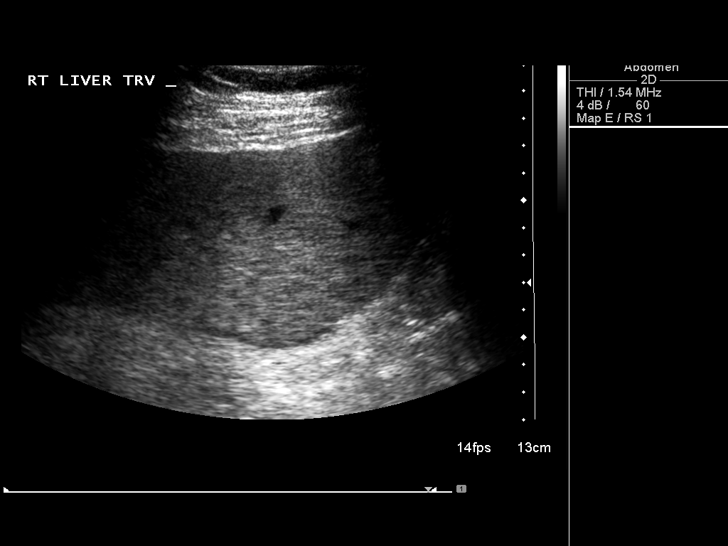
[im 54/118]
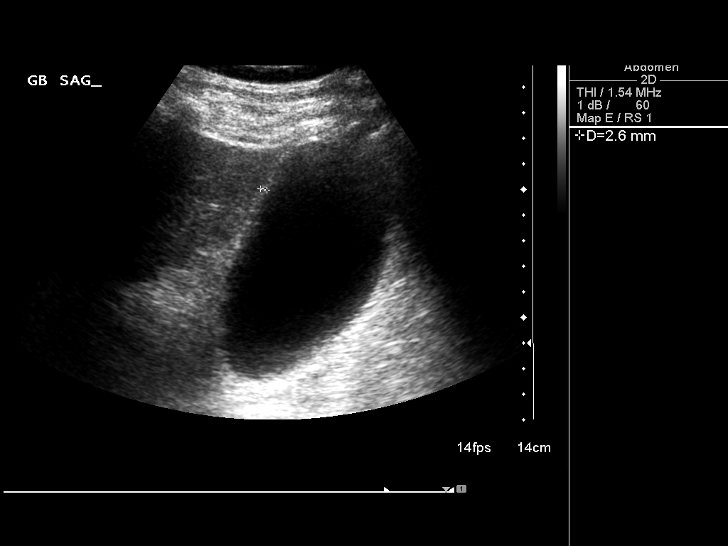
[im 64/118]
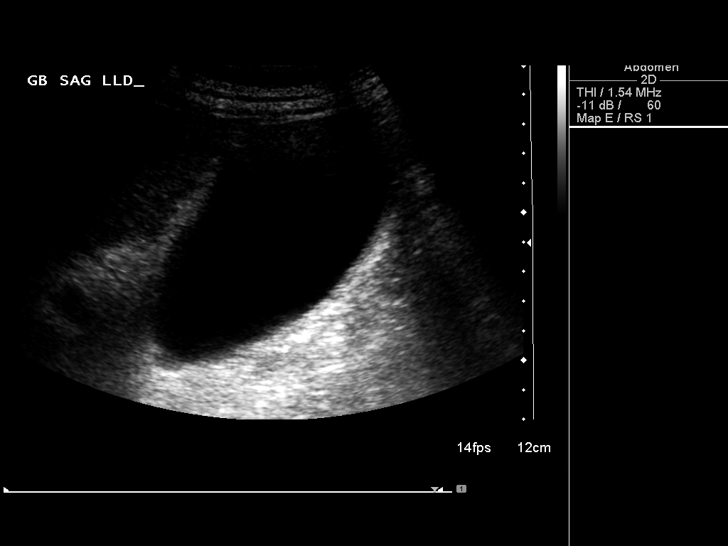
[im 74/118]
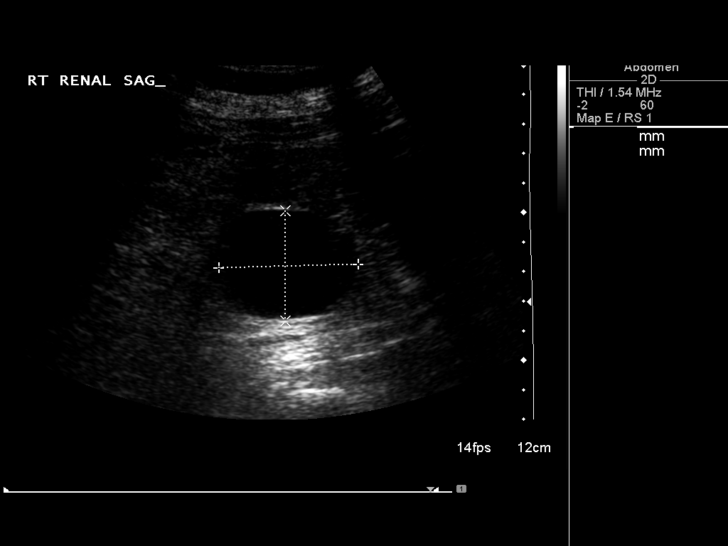
[im 79/118]
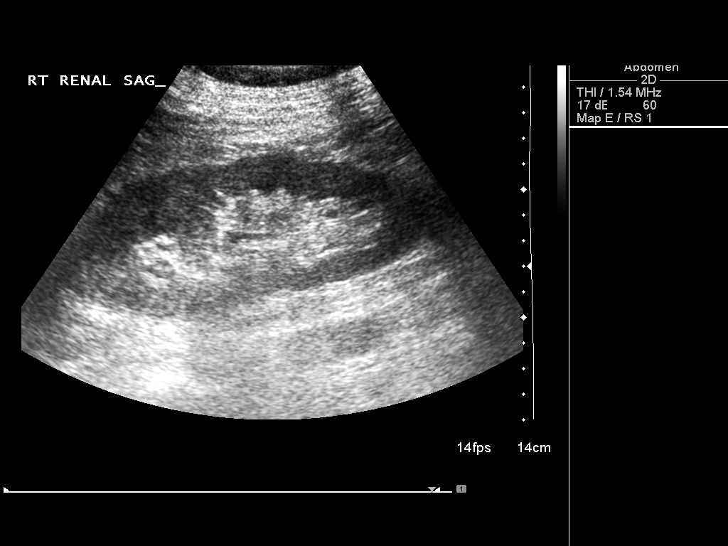
[im 88/118]
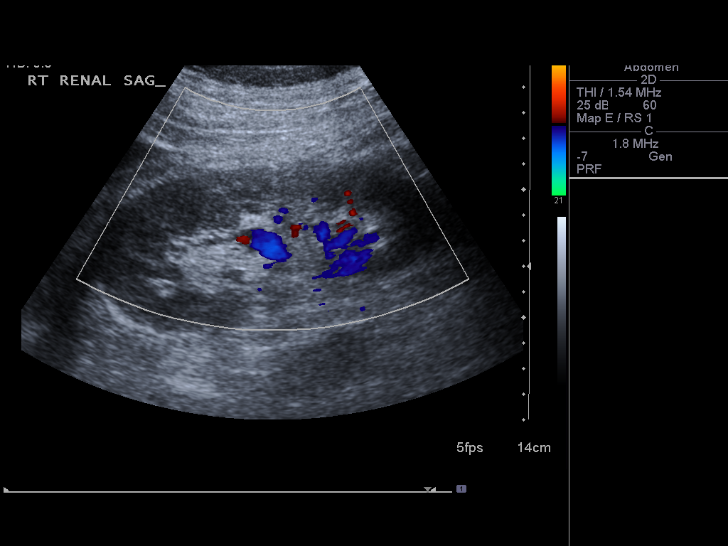
[im 98/118]
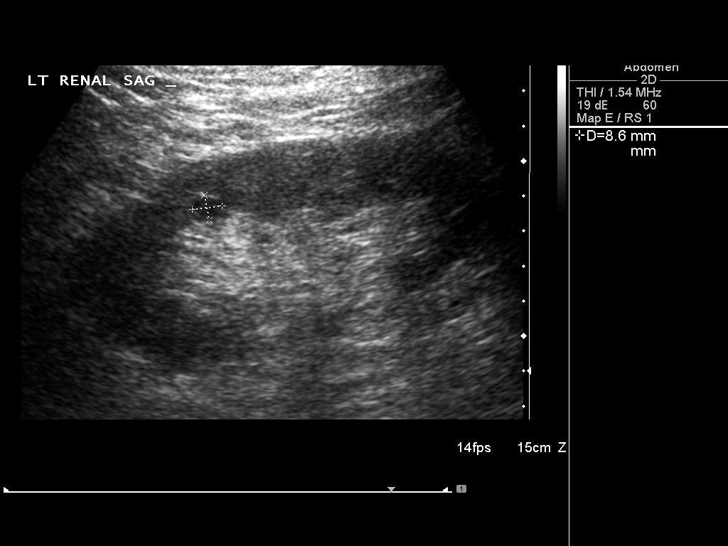
[im 108/118]
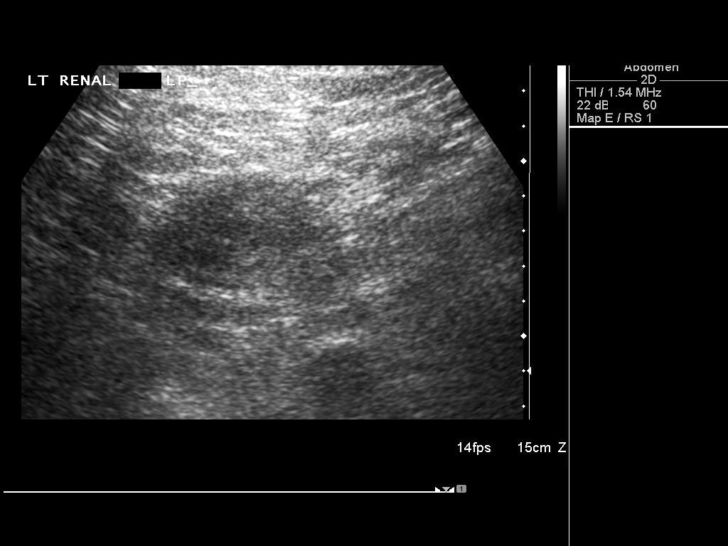
[im 118/118]
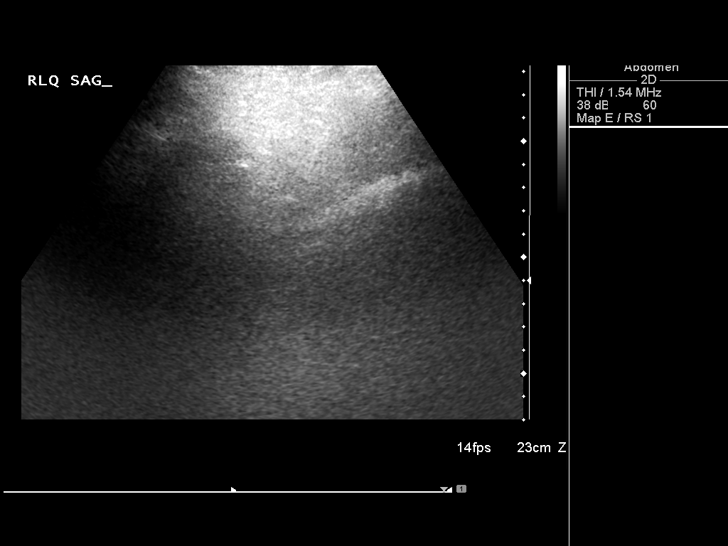

[14 of 25 positions shown; findings below may reference images not displayed]

FINDINGS: Gallbladder: No gallstones or wall thickening visualized. No
sonographic Murphy sign noted by sonographer.

Common bile duct: Diameter: 4 mm

Liver: No focal lesion identified. There is diffuse increased
echotexture of the liver.

IVC: No abnormality visualized.

Pancreas: Visualized portion unremarkable.

Spleen: Size and appearance within normal limits.

Right Kidney: Length: 14.7 cm. Echogenicity within normal limits. No
hydronephrosis visualized. There are simple cysts within the right
kidney, largest measures 4.7 x 3.7 x 3.9 cm in the midpole.

Left Kidney: Length: 14.9 cm. Echogenicity within normal limits. No
hydronephrosis visualized. There is a 0.9 x 0.7 x 1 cm cyst in the
left kidney upper to midpole.

Abdominal aorta: No aneurysm visualized.

Other findings: Small amount of ascites is identified.
IMPRESSION: No acute abnormality. Fatty infiltration of liver. Small amount of
ascites.
